# Patient Record
Sex: Female | Born: 1975 | Race: Black or African American | Hispanic: No | Marital: Married | Smoking: Never smoker
Health system: Southern US, Community
[De-identification: ages and names within clinical notes are randomized; demographics above are authoritative.]

## PROBLEM LIST (undated history)

## (undated) DIAGNOSIS — E785 Hyperlipidemia, unspecified: Secondary | ICD-10-CM

## (undated) DIAGNOSIS — T4145XA Adverse effect of unspecified anesthetic, initial encounter: Secondary | ICD-10-CM

## (undated) DIAGNOSIS — T7840XA Allergy, unspecified, initial encounter: Secondary | ICD-10-CM

## (undated) DIAGNOSIS — R011 Cardiac murmur, unspecified: Secondary | ICD-10-CM

## (undated) DIAGNOSIS — I10 Essential (primary) hypertension: Secondary | ICD-10-CM

## (undated) DIAGNOSIS — E039 Hypothyroidism, unspecified: Secondary | ICD-10-CM

## (undated) DIAGNOSIS — D649 Anemia, unspecified: Secondary | ICD-10-CM

## (undated) DIAGNOSIS — B009 Herpesviral infection, unspecified: Secondary | ICD-10-CM

## (undated) HISTORY — DX: Herpesviral infection, unspecified: B00.9

## (undated) HISTORY — PX: TUBAL LIGATION: SHX77

## (undated) HISTORY — DX: Cardiac murmur, unspecified: R01.1

## (undated) HISTORY — DX: Anemia, unspecified: D64.9

## (undated) HISTORY — DX: Allergy, unspecified, initial encounter: T78.40XA

## (undated) HISTORY — DX: Hyperlipidemia, unspecified: E78.5

## (undated) HISTORY — DX: Essential (primary) hypertension: I10

## (undated) HISTORY — PX: SUPERFICIAL LYMPH NODE BIOPSY / EXCISION: SUR127

---
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
## 2004-07-24 ENCOUNTER — Ambulatory Visit: Payer: Self-pay | Admitting: Endocrinology

## 2004-07-29 ENCOUNTER — Other Ambulatory Visit: Admission: RE | Admit: 2004-07-29 | Discharge: 2004-07-29 | Payer: Self-pay | Admitting: Obstetrics and Gynecology

## 2005-06-03 ENCOUNTER — Ambulatory Visit: Payer: Self-pay | Admitting: Internal Medicine

## 2005-06-11 ENCOUNTER — Ambulatory Visit: Payer: Self-pay | Admitting: Internal Medicine

## 2005-06-12 ENCOUNTER — Ambulatory Visit: Payer: Self-pay | Admitting: Internal Medicine

## 2005-06-22 HISTORY — PX: UTERINE SUSPENSION: SUR1430

## 2005-09-08 ENCOUNTER — Ambulatory Visit: Payer: Self-pay | Admitting: Endocrinology

## 2005-09-09 ENCOUNTER — Ambulatory Visit: Payer: Self-pay | Admitting: Endocrinology

## 2005-10-19 ENCOUNTER — Other Ambulatory Visit: Admission: RE | Admit: 2005-10-19 | Discharge: 2005-10-19 | Payer: Self-pay | Admitting: Obstetrics and Gynecology

## 2005-11-06 ENCOUNTER — Ambulatory Visit: Payer: Self-pay | Admitting: Endocrinology

## 2006-04-16 ENCOUNTER — Encounter: Admission: RE | Admit: 2006-04-16 | Discharge: 2006-04-16 | Payer: Self-pay | Admitting: Obstetrics and Gynecology

## 2006-08-03 ENCOUNTER — Ambulatory Visit: Payer: Self-pay | Admitting: Endocrinology

## 2006-08-03 LAB — CONVERTED CEMR LAB: TSH: 0.17 microintl units/mL — ABNORMAL LOW (ref 0.35–5.50)

## 2006-09-05 ENCOUNTER — Emergency Department (HOSPITAL_COMMUNITY): Admission: EM | Admit: 2006-09-05 | Discharge: 2006-09-05 | Payer: Self-pay | Admitting: Family Medicine

## 2006-09-27 ENCOUNTER — Emergency Department (HOSPITAL_COMMUNITY): Admission: EM | Admit: 2006-09-27 | Discharge: 2006-09-27 | Payer: Self-pay | Admitting: Family Medicine

## 2006-12-06 ENCOUNTER — Other Ambulatory Visit: Admission: RE | Admit: 2006-12-06 | Discharge: 2006-12-06 | Payer: Self-pay | Admitting: Obstetrics & Gynecology

## 2006-12-14 ENCOUNTER — Ambulatory Visit: Payer: Self-pay | Admitting: Endocrinology

## 2007-02-16 ENCOUNTER — Encounter: Payer: Self-pay | Admitting: Endocrinology

## 2007-06-23 DIAGNOSIS — T8859XA Other complications of anesthesia, initial encounter: Secondary | ICD-10-CM

## 2007-06-23 HISTORY — DX: Other complications of anesthesia, initial encounter: T88.59XA

## 2007-08-18 ENCOUNTER — Inpatient Hospital Stay (HOSPITAL_COMMUNITY): Admission: AD | Admit: 2007-08-18 | Discharge: 2007-08-18 | Payer: Self-pay | Admitting: Obstetrics & Gynecology

## 2007-09-02 ENCOUNTER — Ambulatory Visit: Payer: Self-pay | Admitting: Endocrinology

## 2007-09-16 ENCOUNTER — Telehealth: Payer: Self-pay | Admitting: Family Medicine

## 2007-10-07 ENCOUNTER — Encounter: Payer: Self-pay | Admitting: Endocrinology

## 2007-10-10 ENCOUNTER — Telehealth (INDEPENDENT_AMBULATORY_CARE_PROVIDER_SITE_OTHER): Payer: Self-pay | Admitting: *Deleted

## 2007-10-11 ENCOUNTER — Ambulatory Visit: Payer: Self-pay | Admitting: Endocrinology

## 2007-10-27 ENCOUNTER — Ambulatory Visit: Payer: Self-pay | Admitting: Endocrinology

## 2007-10-27 LAB — CONVERTED CEMR LAB: TSH: 1.49 microintl units/mL (ref 0.35–5.50)

## 2007-10-31 ENCOUNTER — Telehealth (INDEPENDENT_AMBULATORY_CARE_PROVIDER_SITE_OTHER): Payer: Self-pay | Admitting: *Deleted

## 2007-11-09 ENCOUNTER — Emergency Department (HOSPITAL_COMMUNITY): Admission: EM | Admit: 2007-11-09 | Discharge: 2007-11-09 | Payer: Self-pay | Admitting: Emergency Medicine

## 2008-02-28 ENCOUNTER — Inpatient Hospital Stay (HOSPITAL_COMMUNITY): Admission: AD | Admit: 2008-02-28 | Discharge: 2008-02-28 | Payer: Self-pay | Admitting: Obstetrics & Gynecology

## 2008-03-28 ENCOUNTER — Ambulatory Visit (HOSPITAL_COMMUNITY): Admission: RE | Admit: 2008-03-28 | Discharge: 2008-03-28 | Payer: Self-pay | Admitting: Obstetrics and Gynecology

## 2008-04-19 ENCOUNTER — Inpatient Hospital Stay (HOSPITAL_COMMUNITY): Admission: AD | Admit: 2008-04-19 | Discharge: 2008-04-23 | Payer: Self-pay | Admitting: Obstetrics

## 2008-12-02 ENCOUNTER — Emergency Department (HOSPITAL_COMMUNITY): Admission: EM | Admit: 2008-12-02 | Discharge: 2008-12-02 | Payer: Self-pay | Admitting: Emergency Medicine

## 2009-06-18 ENCOUNTER — Inpatient Hospital Stay (HOSPITAL_COMMUNITY): Admission: RE | Admit: 2009-06-18 | Discharge: 2009-06-21 | Payer: Self-pay | Admitting: Obstetrics and Gynecology

## 2009-07-30 LAB — HM PAP SMEAR

## 2009-09-03 ENCOUNTER — Emergency Department (HOSPITAL_COMMUNITY): Admission: EM | Admit: 2009-09-03 | Discharge: 2009-09-03 | Payer: Self-pay | Admitting: Internal Medicine

## 2010-03-28 ENCOUNTER — Emergency Department (HOSPITAL_COMMUNITY): Admission: EM | Admit: 2010-03-28 | Discharge: 2010-03-28 | Payer: Self-pay | Admitting: Family Medicine

## 2010-09-04 LAB — POCT RAPID STREP A (OFFICE): Streptococcus, Group A Screen (Direct): NEGATIVE

## 2010-09-22 LAB — CBC
HCT: 26.5 % — ABNORMAL LOW (ref 36.0–46.0)
Hemoglobin: 11.4 g/dL — ABNORMAL LOW (ref 12.0–15.0)
MCHC: 34.2 g/dL (ref 30.0–36.0)
MCV: 92.5 fL (ref 78.0–100.0)
RBC: 2.86 MIL/uL — ABNORMAL LOW (ref 3.87–5.11)
RBC: 3.64 MIL/uL — ABNORMAL LOW (ref 3.87–5.11)
WBC: 10.4 10*3/uL (ref 4.0–10.5)

## 2010-09-22 LAB — RH IMMUNE GLOB WKUP(>/=20WKS)(NOT WOMEN'S HOSP)

## 2010-11-04 NOTE — Op Note (Signed)
Jordan [name], Jordan [name]             ACCOUNT NO.:  [id number]   MEDICAL RECORD NO.:  [id number]          PATIENT TYPE:  INP   LOCATION:  9104                          FACILITY:  [facility]   PHYSICIAN:  Maxie Better, M.D.DATE OF BIRTH:  1976/03/23   DATE OF PROCEDURE:  04/21/2008  DATE OF DISCHARGE:                               OPERATIVE REPORT   PREOPERATIVE DIAGNOSIS:  Arrest of dilatation.   PROCEDURES:  Primary cesarean section  Kerr hysterotomy.   POSTOPERATIVE DIAGNOSIS:  Arrest of dilatation.   ANESTHESIA:  Epidural.   SURGEON:  Maxie Better, MD   ASSISTANT:  None.   PROCEDURE:  Under adequate epidural anesthesia, the patient was placed  in the supine position with a left lateral tilt.  She was sterilely  prepped and draped in usual fashion.  Indwelling Foley catheter was  already in place.  Marcaine 0.25% was injected along the planned  Pfannenstiel skin incision site.  Pfannenstiel skin incision was then  made, carried down to the rectus fascia.  The rectus fascia was opened  transversely.  The rectus fascia was then bluntly and sharply dissected  off the rectus muscle in superior and inferior fashion.  The rectus  muscle was split in the midline.  The parietal peritoneum was entered  bluntly.  The vesicouterine peritoneum was then opened transversely.  The bladder was then bluntly dissected off the lower uterine segment and  displaced inferiorly.  A curvilinear low transverse uterine incision was  then made and extended with bandage scissors.  Subsequent delivery of a  live female infant from the left occiput posterior presentation was  accomplished.  Baby was bulb suctioned in the abdomen.  Cord was clamped  and cut.  The baby was transferred to the awaiting pediatrician who  assigned Apgars of 8 and 9 at 1 and 5 minutes.  Placenta was manually  removed.  Uterine cavity was cleaned of debris.  Uterine incision had a  small extension on the right about 2 cm, which  was closed individually  with 0 Monocryl running locked stitch.  The remaining incision was  closed with 2 layers, the first layer was 0 Monocryl running locked  stitch, second layer was imbricating using 0 Monocryl suture.  Figure-of-  eight suture was placed on the right due to bleeding.  Good hemostasis  subsequently noted.  Normal tubes and ovaries were identified  bilaterally.  The abdomen was copiously irrigated and suctioned of  debris.  The parietal peritoneum, which was opened underneath the right  rectus abdominis muscle was not closed due to its spontaneous placement  underneath the muscle.  The rectus fascia was closed with 0 Vicryl x2.  The subcutaneous area was irrigated and small bleeders cauterized.  Interrupted 2-0 plain sutures were placed and skin approximated with  Ethicon staples.  Specimens, placenta was not sent to  pathology. Estimated blood loss was 500 mL.  Urine output was 200 mL  clear yellow urine.  Intraoperative fluid was 2 L. Sponge and instrument  counts x2 was correct.  Complication was none.  Weight of the baby was 6  pounds 8 ounces.  The patient tolerated the procedure well and was  transferred to recovery in stable condition.      Maxie Better, M.D.  Electronically Signed     [place]/MEDQ  D:  04/21/2008  T:  04/22/2008  Job:  [id number]

## 2010-11-07 NOTE — Discharge Summary (Signed)
Jordan [name], [name]             ACCOUNT NO.:  [id number]   MEDICAL RECORD NO.:  [id number]          PATIENT TYPE:  INP   LOCATION:  9123                          FACILITY:  [facility]   PHYSICIAN:  Maxie Better, M.D.DATE OF BIRTH:  February 17, 1976   DATE OF ADMISSION:  04/19/2008  DATE OF DISCHARGE:  04/23/2008                               DISCHARGE SUMMARY   ADMISSION DIAGNOSES:  1. Hypothyroidism.  2. Term gestation.  3. Rhesus negative.   DISCHARGE DIAGNOSES:  1. Hypothyroidism.  2. Term gestation, delivered.  3. Rhesus negative.  4. Arrested dilatation.   PROCEDURE:  A primary cesarean section.   HISTORY OF PRESENT ILLNESS:  A 35 year old gravida 2, para 0-0-1-0  female at term with hypothyroid on Synthroid who presented for induction  of labor.  Prenatal course was notable for ultrasound on April 12, 2008, that showed a 6-pound 8-ounce baby, which was at 23rd percentile  overall, but the abdominal circumference was at the 4th percentile.   HOSPITAL COURSE:  The patient was admitted to [facility].  She  underwent cervical ripening including Cervidil as well as Cytotec.  The  patient subsequently had artificial rupture of membranes and clear  fluid.  Pitocin was started.  She progressed to 9.5 cm.  Baby was  asymptomatic and subsequently resulted in arrest of dilatation with the  primary cesarean section performed.  Cesarean section resulted in  delivery of a left occiput posterior live female 6 pounds 8 ounces with  Apgars of 8 and 9.  The patient subsequently has an uncomplicated  postoperative course for which she was continued on her Synthroid.  Her  CBC on postop day #1 showed a hemoglobin of 11.3, hematocrit 33.5, white  count of 19.5, and platelet count 198,000.  She was given RhoGAM.  By  postop day #3, the patient had remained afebrile throughout her course  and was deemed well to be discharged home.   DISPOSITION:  Home.   CONDITION:  Stable.   DISCHARGE MEDICATIONS:  1. Ibuprofen 600 mg 1 p.o. q.6 h. p.r.n. pain.  2. Prenatal vitamins 1 p.o. daily.   FOLLOWUP APPOINTMENT:  At [facility] in 6 weeks.  Discharge  instructions per the postpartum booklet given.      Maxie Better, M.D.  Electronically Signed     [place]/MEDQ  D:  05/20/2008  T:  05/20/2008  Job:  [id number]

## 2011-03-13 LAB — HCG, QUANTITATIVE, PREGNANCY: hCG, Beta Chain, Quant, S: 4746 — ABNORMAL HIGH

## 2011-03-13 LAB — ABO/RH: ABO/RH(D): A NEG

## 2011-03-23 LAB — CBC
MCHC: 33.5
MCV: 90.3
Platelets: 219
RDW: 14.1
WBC: 15.2 — ABNORMAL HIGH

## 2011-03-23 LAB — RPR: RPR Ser Ql: NONREACTIVE

## 2011-03-24 LAB — CBC
HCT: 33.5 — ABNORMAL LOW
MCHC: 33.6
MCV: 90.8
RBC: 3.69 — ABNORMAL LOW

## 2011-03-24 LAB — RH IMMUNE GLOB WKUP(>/=20WKS)(NOT WOMEN'S HOSP)

## 2011-05-10 ENCOUNTER — Emergency Department (HOSPITAL_COMMUNITY)
Admission: EM | Admit: 2011-05-10 | Discharge: 2011-05-10 | Disposition: A | Discharge status: Home / Self Care | Payer: 59 | Source: Home / Self Care | Attending: Emergency Medicine | Admitting: Emergency Medicine

## 2011-05-10 NOTE — ED Notes (Signed)
Called for pt; informed that pt. Has left with no intention to return this evening.

## 2011-05-12 ENCOUNTER — Encounter: Payer: Self-pay | Admitting: Emergency Medicine

## 2011-05-12 ENCOUNTER — Emergency Department (INDEPENDENT_AMBULATORY_CARE_PROVIDER_SITE_OTHER)
Admission: EM | Admit: 2011-05-12 | Discharge: 2011-05-12 | Disposition: A | Discharge status: Home / Self Care | Payer: 59 | Source: Home / Self Care

## 2011-05-12 DIAGNOSIS — R058 Other specified cough: Secondary | ICD-10-CM

## 2011-05-12 DIAGNOSIS — R059 Cough, unspecified: Secondary | ICD-10-CM

## 2011-05-12 DIAGNOSIS — R05 Cough: Secondary | ICD-10-CM

## 2011-05-12 HISTORY — DX: Hypothyroidism, unspecified: E03.9

## 2011-05-12 MED ORDER — PREDNISONE 20 MG PO TABS: ORAL_TABLET | ORAL | Status: AC

## 2011-05-12 MED ORDER — GUAIFENESIN ER 600 MG PO TB12: 1200.0000 mg | ORAL_TABLET | Freq: Two times a day (BID) | ORAL | Status: DC

## 2011-05-12 MED ORDER — FEXOFENADINE-PSEUDOEPHED ER 60-120 MG PO TB12: 1.0000 | ORAL_TABLET | Freq: Two times a day (BID) | ORAL | Status: DC

## 2011-05-12 NOTE — ED Notes (Signed)
Cough and chest discomfort, onset 2 weeks ago , non productive cough

## 2011-05-12 NOTE — ED Provider Notes (Signed)
History     CSN: [id number] Arrival date & time: 05/12/2011  6:29 PM   First MD Initiated Contact with Patient 05/12/11 1721      Chief Complaint  Patient presents with  . Cough    (Consider location/radiation/quality/duration/timing/severity/associated sxs/prior treatment) Patient is a 34 y.o. female presenting with cough. The history is provided by the patient.  Cough This is a new problem. The current episode started more than 1 week ago. The problem occurs constantly. The problem has not changed since onset.The cough is non-productive. There has been no fever. Associated symptoms include sweats and rhinorrhea. Pertinent negatives include no chest pain, no weight loss, no headaches, no sore throat, no myalgias, no shortness of breath and no wheezing. She has tried cough syrup (was seen 2 days ago in a Urgent Care  finishing azithromycin Rx. ) for the symptoms. The treatment provided mild relief. She is not a smoker. Her past medical history does not include bronchitis or pneumonia.    Past Medical History  Diagnosis Date  . Hypothyroid     Past Surgical History  Procedure Date  . Cesarean section     History reviewed. No pertinent family history.  History  Substance Use Topics  . Smoking status: Never Smoker   . Smokeless tobacco: Not on file  . Alcohol Use: No    OB History    Grav Para Term Preterm Abortions TAB SAB Ect Mult Living                  Review of Systems  Constitutional: Negative.  Negative for weight loss.  HENT: Positive for congestion and rhinorrhea. Negative for sore throat.   Respiratory: Positive for cough. Negative for chest tightness, shortness of breath, wheezing and stridor.   Cardiovascular: Negative for chest pain, palpitations and leg swelling.  Gastrointestinal: Negative for abdominal pain.  Musculoskeletal: Negative for myalgias and arthralgias.  Skin: Negative for rash.  Neurological: Negative for headaches.    Allergies    Review of patient's allergies indicates no known allergies.  Home Medications   Current Outpatient Rx  Name Route Sig Dispense Refill  . ERYTHROMYCIN STEARATE 500 MG PO TABS Oral Take 500 mg by mouth 3 (three) times daily.      Marland Kitchen LEVOTHYROXINE SODIUM 200 MCG PO TABS Oral Take 200 mcg by mouth daily.      Marland Kitchen FEXOFENADINE-PSEUDOEPHEDRINE 60-120 MG PO TB12 Oral Take 1 tablet by mouth every 12 (twelve) hours. 30 tablet 0  . GUAIFENESIN 600 MG PO TB12 Oral Take 2 tablets (1,200 mg total) by mouth 2 (two) times daily. 10 tablet no  . PREDNISONE 20 MG PO TABS  2 tabs po daily for 5 days 10 tablet no    BP 145/80  Pulse 78  Temp(Src) 99.3 F (37.4 C) (Oral)  Resp 18  SpO2 100%  Physical Exam  Nursing note and vitals reviewed. Constitutional: She is oriented to person, place, and time. She appears well-developed and well-nourished. No distress.  HENT:  Head: Normocephalic and atraumatic.  Right Ear: External ear normal.  Left Ear: External ear normal.  Mouth/Throat: Oropharynx is clear and moist. No oropharyngeal exudate.       Swelling and erythema of nasal turbinates with clear rhinorrhea. Post nasal drip with pharyngeal erythema no exudates.   Eyes: Pupils are equal, round, and reactive to light.       Mild conjunctival erythema bilaterally no eye discharge.   Neck: Normal range of motion.  Cardiovascular: Normal rate,  regular rhythm and normal heart sounds.  Exam reveals no gallop and no friction rub.   No murmur heard.      No low extremity edema.  Pulmonary/Chest: Effort normal and breath sounds normal. No respiratory distress. She has no wheezes. She has no rales. She exhibits no tenderness.       No tachypnea.  Abdominal: Soft. There is no tenderness.  Lymphadenopathy:    She has no cervical adenopathy.  Neurological: She is alert and oriented to person, place, and time.  Skin: No rash noted.    ED Course  Procedures (including critical care time)  Labs Reviewed - No  data to display No results found.   1. Allergic cough       MDM  Impress residual reactive cough from recent URI likely viral as not symptoms change with antibiotic also possible seasonal allergies. Afebrile looks well on exam. Treated with prednisone, guaifenesin, antihistamine with decongestant.        Sharin Grave, MD 05/14/11 1016

## 2011-06-03 ENCOUNTER — Encounter (HOSPITAL_COMMUNITY): Payer: Self-pay

## 2011-06-03 ENCOUNTER — Emergency Department (INDEPENDENT_AMBULATORY_CARE_PROVIDER_SITE_OTHER)
Admission: EM | Admit: 2011-06-03 | Discharge: 2011-06-03 | Disposition: A | Discharge status: Home / Self Care | Payer: 59 | Source: Home / Self Care | Attending: Family Medicine | Admitting: Family Medicine

## 2011-06-03 DIAGNOSIS — R6889 Other general symptoms and signs: Secondary | ICD-10-CM

## 2011-06-03 DIAGNOSIS — J111 Influenza due to unidentified influenza virus with other respiratory manifestations: Secondary | ICD-10-CM

## 2011-06-03 NOTE — ED Notes (Signed)
C/o generalized bodyaches, dry cough, chills, headache, and abdominal cramps since yesterday.  Denies n,v or diarrhea, denies head or sinus congestion.

## 2011-06-03 NOTE — ED Provider Notes (Signed)
History     CSN: [id number] Arrival date & time: 06/03/2011  8:53 AM   First MD Initiated Contact with Patient 06/03/11 [phone]      Chief Complaint  Patient presents with  . Generalized Body Aches  . Fever  . Cough    (Consider location/radiation/quality/duration/timing/severity/associated sxs/prior treatment) HPI Comments: Jordan [name] presents for evaluation of fever, cough, abdominal pain, body aches, and a headache over the last few days. She reports her sons as sick contacts.  Patient is a 35 y.o. female presenting with cough. The history is provided by the patient.  Cough This is a new problem. The current episode started more than 2 days ago. The problem occurs constantly. The cough is non-productive. The maximum temperature recorded prior to her arrival was 101 to 101.9 F. Associated symptoms include chills, headaches, rhinorrhea and myalgias. Pertinent negatives include no sore throat. She is not a smoker.    Past Medical History  Diagnosis Date  . Hypothyroid     Past Surgical History  Procedure Date  . Cesarean section     No family history on file.  History  Substance Use Topics  . Smoking status: Never Smoker   . Smokeless tobacco: Not on file  . Alcohol Use: No    OB History    Grav Para Term Preterm Abortions TAB SAB Ect Mult Living                  Review of Systems  Constitutional: Positive for fever and chills.  HENT: Positive for congestion and rhinorrhea. Negative for sore throat.   Eyes: Negative.   Respiratory: Positive for cough.   Cardiovascular: Negative.   Gastrointestinal: Positive for nausea and abdominal pain. Negative for vomiting.  Genitourinary: Negative.   Musculoskeletal: Positive for myalgias.  Neurological: Positive for headaches.    Allergies  Review of patient's allergies indicates no known allergies.  Home Medications   Current Outpatient Rx  Name Route Sig Dispense Refill  . ERYTHROMYCIN STEARATE 500 MG PO TABS Oral  Take 500 mg by mouth 3 (three) times daily.      Marland Kitchen FEXOFENADINE-PSEUDOEPHED ER 60-120 MG PO TB12 Oral Take 1 tablet by mouth every 12 (twelve) hours. 30 tablet 0  . GUAIFENESIN ER 600 MG PO TB12 Oral Take 2 tablets (1,200 mg total) by mouth 2 (two) times daily. 10 tablet no  . LEVOTHYROXINE SODIUM 200 MCG PO TABS Oral Take 200 mcg by mouth daily.        BP 132/76  Pulse 91  Temp(Src) 98.2 F (36.8 C) (Oral)  Resp 20  SpO2 100%  LMP 05/25/2011  Physical Exam  Nursing note and vitals reviewed. Constitutional: She is oriented to person, place, and time. She appears well-developed and well-nourished.  HENT:  Head: Normocephalic and atraumatic.  Right Ear: Tympanic membrane and external ear normal.  Left Ear: Tympanic membrane and external ear normal.  Mouth/Throat: Uvula is midline, oropharynx is clear and moist and mucous membranes are normal.  Eyes: EOM are normal.  Neck: Normal range of motion.  Pulmonary/Chest: Effort normal and breath sounds normal. She has no wheezes. She has no rhonchi.  Abdominal: Soft. Bowel sounds are normal. There is tenderness in the right upper quadrant and epigastric area. There is no rebound and no guarding.  Musculoskeletal: Normal range of motion.  Neurological: She is alert and oriented to person, place, and time.  Skin: Skin is warm and dry.  Psychiatric: Her behavior is normal.    ED Course  Procedures (including critical care time)  Labs Reviewed - No data to display No results found.   1. Influenza-like illness       MDM          Richardo Priest, MD 06/03/11 1001

## 2011-06-08 ENCOUNTER — Telehealth (HOSPITAL_COMMUNITY): Payer: Self-pay | Admitting: *Deleted

## 2011-09-07 ENCOUNTER — Emergency Department (INDEPENDENT_AMBULATORY_CARE_PROVIDER_SITE_OTHER)
Admission: EM | Admit: 2011-09-07 | Discharge: 2011-09-07 | Disposition: A | Discharge status: Home / Self Care | Payer: 59 | Source: Home / Self Care | Attending: Emergency Medicine | Admitting: Emergency Medicine

## 2011-09-07 ENCOUNTER — Encounter (HOSPITAL_COMMUNITY): Payer: Self-pay | Admitting: *Deleted

## 2011-09-07 DIAGNOSIS — E039 Hypothyroidism, unspecified: Secondary | ICD-10-CM

## 2011-09-07 DIAGNOSIS — Z760 Encounter for issue of repeat prescription: Secondary | ICD-10-CM

## 2011-09-07 MED ORDER — LEVOTHYROXINE SODIUM 200 MCG PO TABS: 200.0000 ug | ORAL_TABLET | Freq: Every day | ORAL | Status: DC

## 2011-09-07 NOTE — Discharge Instructions (Signed)
Hypothyroidism The thyroid is a large gland located in the lower front of your neck. The thyroid gland helps control metabolism. Metabolism is how your body handles food. It controls metabolism with the hormone thyroxine. When this gland is underactive (hypothyroid), it produces too little hormone.  CAUSES These include:   Absence or destruction of thyroid tissue.   Goiter due to iodine deficiency.   Goiter due to medications.   Congenital defects (since birth).   Problems with the pituitary. This causes a lack of TSH (thyroid stimulating hormone). This hormone tells the thyroid to turn out more hormone.  SYMPTOMS  Lethargy (feeling as though you have no energy)   Cold intolerance   Weight gain (in spite of normal food intake)   Dry skin   Coarse hair   Menstrual irregularity (if severe, may lead to infertility)   Slowing of thought processes  Cardiac problems are also caused by insufficient amounts of thyroid hormone. Hypothyroidism in the newborn is cretinism, and is an extreme form. It is important that this form be treated adequately and immediately or it will lead rapidly to retarded physical and mental development. DIAGNOSIS  To prove hypothyroidism, your caregiver may do blood tests and ultrasound tests. Sometimes the signs are hidden. It may be necessary for your caregiver to watch this illness with blood tests either before or after diagnosis and treatment. TREATMENT  Low levels of thyroid hormone are increased by using synthetic thyroid hormone. This is a safe, effective treatment. It usually takes about four weeks to gain the full effects of the medication. After you have the full effect of the medication, it will generally take another four weeks for problems to leave. Your caregiver may start you on low doses. If you have had heart problems the dose may be gradually increased. It is generally not an emergency to get rapidly to normal. HOME CARE INSTRUCTIONS   Take  your medications as your caregiver suggests. Let your caregiver know of any medications you are taking or start taking. Your caregiver will help you with dosage schedules.   As your condition improves, your dosage needs may increase. It will be necessary to have continuing blood tests as suggested by your caregiver.   Report all suspected medication side effects to your caregiver.  SEEK MEDICAL CARE IF: Seek medical care if you develop:  Sweating.   Tremulousness (tremors).   Anxiety.   Rapid weight loss.   Heat intolerance.   Emotional swings.   Diarrhea.   Weakness.  SEEK IMMEDIATE MEDICAL CARE IF:  You develop chest pain, an irregular heart beat (palpitations), or a rapid heart beat. MAKE SURE YOU:   Understand these instructions.   Will watch your condition.   Will get help right away if you are not doing well or get worse.  Document Released: 06/08/2005 Document Revised: 05/28/2011 Document Reviewed: 01/27/2008 [facility] Patient Information 2012 [place], [place].Medication Refill, Emergency Department We have refilled your medication today as a courtesy to you. It is best for your medical care, however, to take care of getting refills done through your primary caregiver's office. They have your records and can do a better job of follow-up than we can in the emergency department. On maintenance medications, we often only prescribe enough medications to get you by until you are able to see your regular caregiver. This is a more expensive way to refill medications. In the future, please plan for refills so that you will not have to use the emergency department for  this. Thank you for your help. Your help allows [place] to better take care of the daily emergencies that enter our department. Document Released: 09/25/2003 Document Revised: 05/28/2011 Document Reviewed: 06/08/2005 [facility] Patient Information 2012 [place], [place].

## 2011-09-07 NOTE — ED Notes (Signed)
Pt  Reports  She  Has  Been  Without  Her  synthriod    For 5  Days     She  Reports  Her PCP  IS   In  [place]    SHE  REPORTS  FEELINGS  OFV WEAKNESS      TIRED  AND  SHAKEY

## 2011-09-07 NOTE — ED Provider Notes (Signed)
History     CSN: [id number]  Arrival date & time 09/07/11  1536   First MD Initiated Contact with Patient 09/07/11 1604      Chief Complaint  Patient presents with  . Medication Refill    (Consider location/radiation/quality/duration/timing/severity/associated sxs/prior treatment) HPI Comments: Patient presents to urgent care today requesting a Synthroid refill it she has: Doctors often they're unable to see her within the next 2 months. She is elicited no specific symptoms. Strictly here for a medication refill and feels that within the last 5 days been feeling a little bit more tired and fatigued she attributes to her not taking her Synthroid  The history is provided by the patient.    Past Medical History  Diagnosis Date  . Hypothyroid     Past Surgical History  Procedure Date  . Cesarean section     No family history on file.  History  Substance Use Topics  . Smoking status: Never Smoker   . Smokeless tobacco: Not on file  . Alcohol Use: No    OB History    Grav Para Term Preterm Abortions TAB SAB Ect Mult Living                  Review of Systems  Constitutional: Positive for fatigue. Negative for fever and chills.  Eyes: Negative for visual disturbance.  Cardiovascular: Negative for chest pain and palpitations.  Neurological: Negative for dizziness and headaches.    Allergies  Review of patient's allergies indicates no known allergies.  Home Medications   Current Outpatient Rx  Name Route Sig Dispense Refill  . LEVOTHYROXINE SODIUM 200 MCG PO TABS Oral Take 1 tablet (200 mcg total) by mouth daily. 30 tablet 1    BP 132/85  Pulse 84  Temp(Src) 98.8 F (37.1 C) (Oral)  Resp 18  SpO2 100%  LMP 08/23/2011  Physical Exam  Nursing note and vitals reviewed. Constitutional: She is oriented to person, place, and time. She appears well-developed and well-nourished. No distress.  Neurological: She is alert and oriented to person, place, and time.    Skin: Skin is warm. She is not diaphoretic.    ED Course  Procedures (including critical care time)  Labs Reviewed - No data to display No results found.   1. HYPOTHYROIDISM   2. Encounter for medication refill       MDM  Patient requesting Synthroid refill        Jimmie Molly, MD 09/07/11 2128

## 2012-03-07 ENCOUNTER — Encounter (HOSPITAL_COMMUNITY): Payer: Self-pay

## 2012-03-11 ENCOUNTER — Encounter (HOSPITAL_COMMUNITY): Payer: Self-pay

## 2012-03-11 ENCOUNTER — Encounter (HOSPITAL_COMMUNITY)
Admission: RE | Admit: 2012-03-11 | Discharge: 2012-03-11 | Disposition: A | Discharge status: Home / Self Care | Payer: 59 | Source: Ambulatory Visit | Attending: Obstetrics and Gynecology | Admitting: Obstetrics and Gynecology

## 2012-03-11 HISTORY — DX: Adverse effect of unspecified anesthetic, initial encounter: T41.45XA

## 2012-03-11 LAB — CBC
MCHC: 32 g/dL (ref 30.0–36.0)
MCV: 84.8 fL (ref 78.0–100.0)
Platelets: 288 10*3/uL (ref 150–400)
RDW: 13.8 % (ref 11.5–15.5)
WBC: 6.1 10*3/uL (ref 4.0–10.5)

## 2012-03-11 NOTE — Patient Instructions (Addendum)
[address] IS[address]LE TUCKETT  03/11/2012   Your procedure is scheduled on:  03/21/12  Enter through the Main Entrance of [facility] at 730 AM.  Pick up the phone at the desk and dial [phone].   Call this number if you have problems the morning of surgery: [phone]   Remember:   Do not eat food:After Midnight.  Do not drink clear liquids: After Midnight.  Take these medicines the morning of surgery with A SIP OF WATER: thyroid medication   Do not wear jewelry, make-up or nail polish.  Do not wear lotions, powders, or perfumes. You may wear deodorant.  Do not shave 48 hours prior to surgery.  Do not bring valuables to the hospital.  Contacts, dentures or bridgework may not be worn into surgery.  Leave suitcase in the car. After surgery it may be brought to your room.  For patients admitted to the hospital, checkout time is 11:00 AM the day of discharge.   Patients discharged the day of surgery will not be allowed to drive home.  Name and phone number of your driver: husband  Special Instructions: Shower using CHG 2 nights before surgery and the night before surgery.  If you shower the day of surgery use CHG.  Use special wash - you have one bottle of CHG for all showers.  You should use approximately 1/3 of the bottle for each shower.   Please read over the following fact sheets that you were given: MRSA Information

## 2012-03-14 ENCOUNTER — Other Ambulatory Visit: Payer: Self-pay | Admitting: Obstetrics and Gynecology

## 2012-03-16 ENCOUNTER — Other Ambulatory Visit: Payer: Self-pay | Admitting: Obstetrics and Gynecology

## 2012-03-21 ENCOUNTER — Encounter (HOSPITAL_COMMUNITY): Payer: Self-pay | Admitting: Anesthesiology

## 2012-03-21 ENCOUNTER — Ambulatory Visit (HOSPITAL_COMMUNITY): Payer: 59 | Admitting: Anesthesiology

## 2012-03-21 ENCOUNTER — Ambulatory Visit (HOSPITAL_COMMUNITY)
Admission: AD | Admit: 2012-03-21 | Discharge: 2012-03-21 | Disposition: A | Discharge status: Home / Self Care | Payer: 59 | Source: Ambulatory Visit | Attending: Obstetrics and Gynecology | Admitting: Obstetrics and Gynecology

## 2012-03-21 ENCOUNTER — Encounter (HOSPITAL_COMMUNITY)
Admission: AD | Disposition: A | Discharge status: Home / Self Care | Payer: Self-pay | Source: Ambulatory Visit | Attending: Obstetrics and Gynecology

## 2012-03-21 DIAGNOSIS — Z302 Encounter for sterilization: Secondary | ICD-10-CM | POA: Insufficient documentation

## 2012-03-21 DIAGNOSIS — Z30432 Encounter for removal of intrauterine contraceptive device: Secondary | ICD-10-CM | POA: Insufficient documentation

## 2012-03-21 HISTORY — PX: IUD REMOVAL: SHX5392

## 2012-03-21 HISTORY — PX: LAPAROSCOPIC TUBAL LIGATION: SHX1937

## 2012-03-21 HISTORY — PX: HYSTEROSCOPY: SHX211

## 2012-03-21 SURGERY — LIGATION, FALLOPIAN TUBE, LAPAROSCOPIC
Anesthesia: General | Wound class: Clean Contaminated

## 2012-03-21 MED ORDER — FENTANYL CITRATE 0.05 MG/ML IJ SOLN
INTRAMUSCULAR | Status: DC | PRN
  Administered 2012-03-21: 100 ug via INTRAVENOUS
  Administered 2012-03-21 (×3): 50 ug via INTRAVENOUS

## 2012-03-21 MED ORDER — IBUPROFEN 800 MG PO TABS: 800.0000 mg | ORAL_TABLET | Freq: Three times a day (TID) | ORAL | Status: DC | PRN

## 2012-03-21 MED ORDER — BUPIVACAINE HCL (PF) 0.25 % IJ SOLN
INTRAMUSCULAR | Status: DC | PRN
  Administered 2012-03-21: 4 mL

## 2012-03-21 MED ORDER — DEXAMETHASONE SODIUM PHOSPHATE 10 MG/ML IJ SOLN
INTRAMUSCULAR | Status: AC
  Filled 2012-03-21: qty 1

## 2012-03-21 MED ORDER — FENTANYL CITRATE 0.05 MG/ML IJ SOLN
INTRAMUSCULAR | Status: AC
  Filled 2012-03-21: qty 5

## 2012-03-21 MED ORDER — NEOSTIGMINE METHYLSULFATE 1 MG/ML IJ SOLN
INTRAMUSCULAR | Status: DC | PRN
  Administered 2012-03-21: 2 mg via INTRAVENOUS

## 2012-03-21 MED ORDER — LIDOCAINE HCL (CARDIAC) 20 MG/ML IV SOLN
INTRAVENOUS | Status: AC
  Filled 2012-03-21: qty 5

## 2012-03-21 MED ORDER — GLYCOPYRROLATE 0.2 MG/ML IJ SOLN
INTRAMUSCULAR | Status: DC | PRN
  Administered 2012-03-21: 0.4 mg via INTRAVENOUS

## 2012-03-21 MED ORDER — PROPOFOL 10 MG/ML IV EMUL
INTRAVENOUS | Status: DC | PRN
  Administered 2012-03-21: 25 mg via INTRAVENOUS
  Administered 2012-03-21: 5 mg via INTRAVENOUS

## 2012-03-21 MED ORDER — GLYCINE 1.5 % IR SOLN
Status: DC | PRN
  Administered 2012-03-21: 500 mL

## 2012-03-21 MED ORDER — CHLOROPROCAINE HCL 1 % IJ SOLN
INTRAMUSCULAR | Status: AC
  Filled 2012-03-21: qty 30

## 2012-03-21 MED ORDER — KETOROLAC TROMETHAMINE 30 MG/ML IJ SOLN
INTRAMUSCULAR | Status: DC | PRN
  Administered 2012-03-21: 30 mg via INTRAVENOUS
  Administered 2012-03-21: 30 mg via INTRAMUSCULAR

## 2012-03-21 MED ORDER — 0.9 % SODIUM CHLORIDE (POUR BTL) OPTIME
TOPICAL | Status: DC | PRN
  Administered 2012-03-21: 1000 mL

## 2012-03-21 MED ORDER — NEOSTIGMINE METHYLSULFATE 1 MG/ML IJ SOLN
INTRAMUSCULAR | Status: AC
  Filled 2012-03-21: qty 10

## 2012-03-21 MED ORDER — PROMETHAZINE HCL 25 MG/ML IJ SOLN: 6.2500 mg | INTRAMUSCULAR | Status: DC | PRN

## 2012-03-21 MED ORDER — LIDOCAINE HCL (CARDIAC) 20 MG/ML IV SOLN
INTRAVENOUS | Status: DC | PRN
  Administered 2012-03-21: 80 mg via INTRAVENOUS

## 2012-03-21 MED ORDER — FENTANYL CITRATE 0.05 MG/ML IJ SOLN
INTRAMUSCULAR | Status: AC
  Filled 2012-03-21: qty 2

## 2012-03-21 MED ORDER — FENTANYL CITRATE 0.05 MG/ML IJ SOLN: 25.0000 ug | INTRAMUSCULAR | Status: DC | PRN

## 2012-03-21 MED ORDER — MEPERIDINE HCL 25 MG/ML IJ SOLN: 6.2500 mg | INTRAMUSCULAR | Status: DC | PRN

## 2012-03-21 MED ORDER — MIDAZOLAM HCL 5 MG/5ML IJ SOLN
INTRAMUSCULAR | Status: DC | PRN
  Administered 2012-03-21: 2 mg via INTRAVENOUS

## 2012-03-21 MED ORDER — HYDROCODONE-ACETAMINOPHEN 5-500 MG PO TABS: 1.0000 | ORAL_TABLET | Freq: Four times a day (QID) | ORAL | Status: DC | PRN

## 2012-03-21 MED ORDER — ROCURONIUM BROMIDE 100 MG/10ML IV SOLN
INTRAVENOUS | Status: DC | PRN
  Administered 2012-03-21: 5 mg via INTRAVENOUS
  Administered 2012-03-21: 25 mg via INTRAVENOUS

## 2012-03-21 MED ORDER — MIDAZOLAM HCL 2 MG/2ML IJ SOLN: 0.5000 mg | Freq: Once | INTRAMUSCULAR | Status: DC | PRN

## 2012-03-21 MED ORDER — DEXAMETHASONE SODIUM PHOSPHATE 4 MG/ML IJ SOLN
INTRAMUSCULAR | Status: DC | PRN
  Administered 2012-03-21: 10 mg via INTRAVENOUS

## 2012-03-21 MED ORDER — BUPIVACAINE HCL (PF) 0.25 % IJ SOLN
INTRAMUSCULAR | Status: AC
  Filled 2012-03-21: qty 30

## 2012-03-21 MED ORDER — ONDANSETRON HCL 4 MG/2ML IJ SOLN
INTRAMUSCULAR | Status: DC | PRN
  Administered 2012-03-21: 4 mg via INTRAVENOUS

## 2012-03-21 MED ORDER — LACTATED RINGERS IV SOLN
INTRAVENOUS | Status: DC
  Administered 2012-03-21 (×3): via INTRAVENOUS

## 2012-03-21 MED ORDER — KETOROLAC TROMETHAMINE 60 MG/2ML IM SOLN
INTRAMUSCULAR | Status: AC
  Filled 2012-03-21: qty 2

## 2012-03-21 MED ORDER — ONDANSETRON HCL 4 MG/2ML IJ SOLN
INTRAMUSCULAR | Status: AC
  Filled 2012-03-21: qty 2

## 2012-03-21 MED ORDER — CHLOROPROCAINE HCL 1 % IJ SOLN
INTRAMUSCULAR | Status: DC | PRN
  Administered 2012-03-21: 10 mL

## 2012-03-21 MED ORDER — PROPOFOL 10 MG/ML IV EMUL
INTRAVENOUS | Status: AC
  Filled 2012-03-21: qty 20

## 2012-03-21 MED ORDER — GLYCOPYRROLATE 0.2 MG/ML IJ SOLN
INTRAMUSCULAR | Status: AC
  Filled 2012-03-21: qty 2

## 2012-03-21 MED ORDER — KETOROLAC TROMETHAMINE 30 MG/ML IJ SOLN: 15.0000 mg | Freq: Once | INTRAMUSCULAR | Status: DC | PRN

## 2012-03-21 MED ORDER — MIDAZOLAM HCL 2 MG/2ML IJ SOLN
INTRAMUSCULAR | Status: AC
  Filled 2012-03-21: qty 2

## 2012-03-21 MED ORDER — ROCURONIUM BROMIDE 50 MG/5ML IV SOLN
INTRAVENOUS | Status: AC
  Filled 2012-03-21: qty 1

## 2012-03-21 SURGICAL SUPPLY — 35 items
ADH SKN CLS APL DERMABOND .7 (GAUZE/BANDAGES/DRESSINGS) ×2
CANISTER SUCTION 2500CC (MISCELLANEOUS) ×4 IMPLANT
CATH ROBINSON RED A/P 16FR (CATHETERS) ×4 IMPLANT
CLOTH BEACON ORANGE TIMEOUT ST (SAFETY) ×4 IMPLANT
CONTAINER PREFILL 10% NBF 60ML (FORM) ×8 IMPLANT
DERMABOND ADVANCED (GAUZE/BANDAGES/DRESSINGS) ×1
DERMABOND ADVANCED .7 DNX12 (GAUZE/BANDAGES/DRESSINGS) ×3 IMPLANT
DRESSING TELFA 8X3 (GAUZE/BANDAGES/DRESSINGS) ×4 IMPLANT
DRSG COVADERM PLUS 2X2 (GAUZE/BANDAGES/DRESSINGS) ×4 IMPLANT
ELECT REM PT RETURN 9FT ADLT (ELECTROSURGICAL) ×4
ELECTRODE REM PT RTRN 9FT ADLT (ELECTROSURGICAL) ×3 IMPLANT
GLOVE BIO SURGEON STRL SZ 6.5 (GLOVE) ×8 IMPLANT
GLOVE BIOGEL PI IND STRL 6.5 (GLOVE) ×3 IMPLANT
GLOVE BIOGEL PI IND STRL 7.0 (GLOVE) ×6 IMPLANT
GLOVE BIOGEL PI INDICATOR 6.5 (GLOVE) ×1
GLOVE BIOGEL PI INDICATOR 7.0 (GLOVE) ×2
GLOVE ECLIPSE 6.0 STRL STRAW (GLOVE) ×8 IMPLANT
GLOVE INDICATOR 7.0 STRL GRN (GLOVE) ×4 IMPLANT
GOWN PREVENTION PLUS LG XLONG (DISPOSABLE) ×8 IMPLANT
GOWN STRL REIN XL XLG (GOWN DISPOSABLE) ×12 IMPLANT
LOOP ANGLED CUTTING 22FR (CUTTING LOOP) IMPLANT
NEEDLE INSUFFLATION 14GA 120MM (NEEDLE) ×4 IMPLANT
NEEDLE SPNL 22GX3.5 QUINCKE BK (NEEDLE) ×4 IMPLANT
PACK HYSTEROSCOPY LF (CUSTOM PROCEDURE TRAY) ×4 IMPLANT
PACK LAPAROSCOPY BASIN (CUSTOM PROCEDURE TRAY) ×4 IMPLANT
PACK VAGINAL MINOR WOMEN LF (CUSTOM PROCEDURE TRAY) IMPLANT
PAD OB MATERNITY 4.3X12.25 (PERSONAL CARE ITEMS) ×4 IMPLANT
PAD PREP 24X48 CUFFED NSTRL (MISCELLANEOUS) ×4 IMPLANT
SUT VICRYL 0 UR6 27IN ABS (SUTURE) ×4 IMPLANT
SUT VICRYL 4-0 PS2 18IN ABS (SUTURE) ×4 IMPLANT
SYR CONTROL 10ML LL (SYRINGE) ×4 IMPLANT
TOWEL OR 17X24 6PK STRL BLUE (TOWEL DISPOSABLE) ×8 IMPLANT
TROCAR Z-THREAD BLADED 11X100M (TROCAR) ×4 IMPLANT
TROCAR Z-THREAD BLADED 5X100MM (TROCAR) ×4 IMPLANT
WATER STERILE IRR 1000ML POUR (IV SOLUTION) ×4 IMPLANT

## 2012-03-21 NOTE — Anesthesia Preprocedure Evaluation (Signed)
Anesthesia Evaluation  Patient identified by MRN, date of birth, ID band Patient awake    Reviewed: Allergy & Precautions, H&P , Patient's Chart, lab work & pertinent test results, reviewed documented beta blocker date and time   Airway Mallampati: II TM Distance: >3 FB Neck ROM: full    Dental No notable dental hx.    Pulmonary neg pulmonary ROS,  breath sounds clear to auscultation  Pulmonary exam normal       Cardiovascular Exercise Tolerance: Good negative cardio ROS  Rhythm:regular Rate:Normal     Neuro/Psych negative neurological ROS  negative psych ROS   GI/Hepatic negative GI ROS, Neg liver ROS,   Endo/Other  negative endocrine ROSHypothyroidism   Renal/GU negative Renal ROS     Musculoskeletal   Abdominal   Peds  Hematology negative hematology ROS (+)   Anesthesia Other Findings Hypothyroid     Complication of anesthesia 2009 failed epidural, severe pain from second attempt   Reproductive/Obstetrics negative OB ROS                           Anesthesia Physical Anesthesia Plan  ASA: II  Anesthesia Plan: General ETT   Post-op Pain Management:    Induction:   Airway Management Planned:   Additional Equipment:   Intra-op Plan:   Post-operative Plan:   Informed Consent: I have reviewed the patients History and Physical, chart, labs and discussed the procedure including the risks, benefits and alternatives for the proposed anesthesia with the patient or authorized representative who has indicated his/her understanding and acceptance.   Dental Advisory Given  Plan Discussed with: CRNA and Surgeon  Anesthesia Plan Comments:         Anesthesia Quick Evaluation

## 2012-03-21 NOTE — Brief Op Note (Signed)
03/21/2012  10:28 AM  PATIENT:  Jordan [name]  36 y.o. female  PRE-OPERATIVE DIAGNOSIS:  desires sterilization, retained IUD  POST-OPERATIVE DIAGNOSIS:  desires sterilization, retained IUD  PROCEDURE:  Laparoscopic tubal ligation with bipolar cautery, removal of IUD, diagnostic hysteroscopy   SURGEON:  Surgeon(s) and Role:    * Mega Kinkade Cathie Beams, MD - Primary  PHYSICIAN ASSISTANT:   ASSISTANTS: none   ANESTHESIA:   general and paracervical block  EBL:  Total I/O In: 1000 [I.V.:1000] Out: 175 [Urine:150; Blood:25]  BLOOD ADMINISTERED:none  DRAINS: none   LOCAL MEDICATIONS USED:  MARCAINE    and OTHER nesicaine  SPECIMEN:  No Specimen  DISPOSITION OF SPECIMEN:  N/A  COUNTS:  YES  TOURNIQUET:  * No tourniquets in log *  DICTATION: .Other Dictation: Dictation Number [id number]  PLAN OF CARE: Discharge to home after PACU  PATIENT DISPOSITION:  PACU - hemodynamically stable.   Delay start of Pharmacological VTE agent (>24hrs) due to surgical blood loss or risk of bleeding: no 90 cc

## 2012-03-21 NOTE — Preoperative (Signed)
Beta Blockers   Reason not to administer Beta Blockers:Not Applicable 

## 2012-03-21 NOTE — Anesthesia Postprocedure Evaluation (Signed)
Anesthesia Post Note  Patient: Jordan [name]  Procedure(s) Performed: Procedure(s) (LRB): LAPAROSCOPIC TUBAL LIGATION (Bilateral) INTRAUTERINE DEVICE (IUD) REMOVAL (N/A) HYSTEROSCOPY ()  Anesthesia type: GA  Patient location: PACU  Post pain: Pain level controlled  Post assessment: Post-op Vital signs reviewed  Last Vitals:  Filed Vitals:   03/21/12 1030  BP: 109/71  Pulse: 69  Temp: 36.9 C  Resp: 16    Post vital signs: Reviewed  Level of consciousness: sedated  Complications: No apparent anesthesia complications

## 2012-03-21 NOTE — Transfer of Care (Signed)
Immediate Anesthesia Transfer of Care Note  Patient: Jordan [name]  Procedure(s) Performed: Procedure(s) (LRB) with comments: LAPAROSCOPIC TUBAL LIGATION (Bilateral) - with cautery INTRAUTERINE DEVICE (IUD) REMOVAL (N/A) HYSTEROSCOPY ()  Patient Location: PACU  Anesthesia Type: General  Level of Consciousness: awake, alert , oriented and patient cooperative  Airway & Oxygen Therapy: Patient Spontanous Breathing and Patient connected to nasal cannula oxygen  Post-op Assessment: Report given to PACU RN and Post -op Vital signs reviewed and stable  Post vital signs: Reviewed, stable  Complications: No apparent anesthesia complications

## 2012-03-21 NOTE — OR Nursing (Signed)
0830 History and physical on paper on the chart.

## 2012-03-22 ENCOUNTER — Encounter (HOSPITAL_COMMUNITY): Payer: Self-pay | Admitting: Obstetrics and Gynecology

## 2012-03-23 NOTE — Op Note (Signed)
NAMEODEAL, WELDEN NO.:  [id number]  MEDICAL RECORD NO.:  [id number]  LOCATION:  [facility]                          [facility]ACILITY:  [facility]  PHYSICIAN:  Maxie Better, M.D.DATE OF BIRTH:  May 30, 1976  DATE OF PROCEDURE:  03/21/2012 DATE OF DISCHARGE:  03/21/2012                              OPERATIVE REPORT   PREOPERATIVE DIAGNOSIS:  Desires sterilization, retained intrauterine device.  PROCEDURES:  Laparoscopic tubal ligation with bipolar cautery, removal of IUD, diagnostic hysteroscopy.  POSTOPERATIVE DIAGNOSIS:  Desires sterilization, retained intrauterine device.  ANESTHESIA:  General, paracervical block.  SURGEON:  Maxie Better, M.D.  ASSISTANT:  None.  PROCEDURE:  Under adequate general anesthesia, the patient was placed in the dorsal lithotomy position.  She was sterilely prepped and draped in usual fashion.  The bladder was catheterized for moderate amount of urine.  Examination under anesthesia revealed anteverted uterus.  No adnexal masses could be appreciated.  A bivalve speculum was placed in the vagina.  Single-tooth tenaculum was placed on the anterior lip of the cervix.  1% Nesacaine was injected paracervically at the 3 and 9 o'clock position.The IUD string was not seen.  The cervix was dilated with #25 Shawnie Pons dilator.  The ring forceps was then used to explore the uterine cavity.  After several attempts, the IUD was able to be removed intact.  Diagnostic hysteroscope was then inserted.  Both tubal ostia could be seen.  No lesions were noted in the uterine cavity.  The hysteroscope was removed.  An Acorn cannula was introduced into the cervical os and attached to tenaculum for manipulation of the uterus. The bivalve speculum was removed.  Attention was then turned to the abdomen.  0.25% Marcaine was injected infraumbilically.  An infraumbilical incision was then made.  Veress needle was introduced and tested with normal saline.  3 L of CO2 was  subsequently insufflated. Veress needle was then removed.  A 10-mm disposable trocar with sleeve was introduced into the abdomen without incident.  A lighted video laparoscope was placed through that port.  Panoramic inspection was then performed.  Normal liver edge and gallbladder was noted.  The uterus was noted to have some bladder adhesions anteriorly.  Second incision was made suprapubically after 0.25% Marcaine injected into the skin. 5-mm port was placed under direct visualization.  A probe was then utilized to assess the pelvis.  No endometriosis noted in the posterior and anterior cul-de-sac.  The left tube had a distal paratubal cyst.  Right tube was normal.  Both ovaries were normal.  Using the bipolar cautery, the midportion of both fallopian tubes was cauterized, when this was felt to be adequate, the suprapubic site was removed, the abdomen deflated, and the infraumbilical site was removed under direct visualization taking care not to bring up any underlying structures. The incision was then closed with 4-0 Vicryl sutures.  SPECIMENS:  None.  ESTIMATED BLOOD LOSS:  Minimal.  COMPLICATIONS:  None.  INTRAOPERATIVE FLUID:  1 L.  URINE OUTPUT:  150 mL.  Sponge and instrument counts x2 was correct.  The patient tolerated the procedure well, was transferred to the recovery room in a stable condition.     Maxie Better, M.D.  Geraldine/MEDQ  D:  03/22/2012  T:  03/23/2012  Job:  [id number]

## 2012-05-31 ENCOUNTER — Emergency Department (HOSPITAL_COMMUNITY)
Admission: EM | Admit: 2012-05-31 | Discharge: 2012-05-31 | Disposition: A | Discharge status: Home / Self Care | Payer: 59 | Source: Home / Self Care | Attending: Family Medicine | Admitting: Family Medicine

## 2012-05-31 ENCOUNTER — Encounter (HOSPITAL_COMMUNITY): Payer: Self-pay | Admitting: *Deleted

## 2012-05-31 DIAGNOSIS — J40 Bronchitis, not specified as acute or chronic: Secondary | ICD-10-CM

## 2012-05-31 MED ORDER — GUAIFENESIN-CODEINE 100-10 MG/5ML PO SYRP: 5.0000 mL | ORAL_SOLUTION | Freq: Three times a day (TID) | ORAL | Status: DC | PRN

## 2012-05-31 MED ORDER — CETIRIZINE-PSEUDOEPHEDRINE ER 5-120 MG PO TB12: 1.0000 | ORAL_TABLET | Freq: Two times a day (BID) | ORAL | Status: DC

## 2012-05-31 MED ORDER — PREDNISONE 20 MG PO TABS: ORAL_TABLET | ORAL | Status: DC

## 2012-05-31 NOTE — ED Provider Notes (Signed)
History     CSN: [id number]  Arrival date & time 05/31/12  1001   First MD Initiated Contact with Patient 05/31/12 1014      Chief Complaint  Patient presents with  . Cough    (Consider location/radiation/quality/duration/timing/severity/associated sxs/prior treatment) HPI Comments: 36 year old female nonsmoker here complaining of persistent cough and wheezing. Patient states that she was treated for bronchitis about 2 weeks ago with azithromycin. Her symptoms have greatly improved but cough has persisted. She reports dry cough with coughing spells worse at nighttime associated with wheezing. She has used albuterol with transient improvement. No prior history of asthma. No leg swelling or history of hypertension. She's taking Tessalon Perles during daytime with some improvement. Appetite is good. No fever or chills. No pleuritic type of chest pain. No headache. No bloody aches.   Past Medical History  Diagnosis Date  . Hypothyroid   . Complication of anesthesia 2009    failed epidural, severe pain from second attempt    Past Surgical History  Procedure Date  . Cesarean section   . Uterine suspension 2007  . Dilation and curettage of uterus 1995    TAB  . Superficial lymph node biopsy / excision   . Laparoscopic tubal ligation 03/21/2012    Procedure: LAPAROSCOPIC TUBAL LIGATION;  Surgeon: Serita Kyle, MD;  Location: [facility];  Service: Gynecology;  Laterality: Bilateral;  with cautery  . Iud removal 03/21/2012    Procedure: INTRAUTERINE DEVICE (IUD) REMOVAL;  Surgeon: Serita Kyle, MD;  Location: [facility];  Service: Gynecology;  Laterality: N/A;  . Hysteroscopy 03/21/2012    Procedure: HYSTEROSCOPY;  Surgeon: Serita Kyle, MD;  Location: [facility];  Service: Gynecology;;    No family history on file.  History  Substance Use Topics  . Smoking status: Never Smoker   . Smokeless tobacco: Not on file  . Alcohol Use: No    OB History    Grav Para Term  Preterm Abortions TAB SAB Ect Mult Living                  Review of Systems  Constitutional: Negative for fever, chills, diaphoresis, activity change, appetite change, fatigue and unexpected weight change.  HENT: Negative for congestion, sore throat, rhinorrhea, trouble swallowing and sinus pressure.   Eyes: Negative for discharge.  Respiratory: Positive for cough and wheezing.   Cardiovascular: Negative for chest pain, palpitations and leg swelling.  Gastrointestinal: Negative for nausea, vomiting, abdominal pain and diarrhea.  Skin: Negative for rash.  Neurological: Negative for dizziness and headaches.    Allergies  Review of patient's allergies indicates no known allergies.  Home Medications   Current Outpatient Rx  Name  Route  Sig  Dispense  Refill  . ACETAMINOPHEN 325 MG PO TABS   Oral   Take 650 mg by mouth daily as needed. Carpel tunnel pain         . CETIRIZINE-PSEUDOEPHEDRINE ER 5-120 MG PO TB12   Oral   Take 1 tablet by mouth 2 (two) times daily.   30 tablet   0   . GUAIFENESIN-CODEINE 100-10 MG/5ML PO SYRP   Oral   Take 5 mLs by mouth 3 (three) times daily as needed for cough.   120 mL   0   . HYDROCODONE-ACETAMINOPHEN 5-500 MG PO TABS   Oral   Take 1 tablet by mouth every 6 (six) hours as needed for pain.   30 tablet   0   . IBUPROFEN 200 MG  PO TABS   Oral   Take 400 mg by mouth daily as needed. During menstrual cycle         . IBUPROFEN 800 MG PO TABS   Oral   Take 1 tablet (800 mg total) by mouth every 8 (eight) hours as needed for pain.   30 tablet   2   . LEVOTHYROXINE SODIUM 100 MCG PO TABS   Oral   Take 100 mcg by mouth daily.         Marland Kitchen MELATONIN 10 MG PO TABS   Oral   Take 1 tablet by mouth at bedtime as needed. For insomnia         . NYSTATIN 100000 UNIT/GM EX OINT   Topical   Apply 1 application topically daily as needed. For skin irritation         . PREDNISONE 20 MG PO TABS      2 tabs by mouth daily for 5 days    10 tablet   0   . TRAMADOL HCL 50 MG PO TABS   Oral   Take 50-100 mg by mouth 2 (two) times daily as needed. For pain           BP 134/90  Pulse 92  Temp 98.6 F (37 C) (Oral)  Resp 18  SpO2 100%  LMP 05/26/2012  Physical Exam  Nursing note and vitals reviewed. Constitutional: She is oriented to person, place, and time. She appears well-developed and well-nourished. No distress.  HENT:  Head: Normocephalic and atraumatic.  Right Ear: External ear normal.  Left Ear: External ear normal.  Nose: Nose normal.  Mouth/Throat: Oropharynx is clear and moist. No oropharyngeal exudate.  Eyes: Conjunctivae normal are normal. Right eye exhibits no discharge. Left eye exhibits no discharge.  Neck: Neck supple. No JVD present. No thyromegaly present.  Cardiovascular: Normal rate, regular rhythm and normal heart sounds.  Exam reveals no gallop and no friction rub.   No murmur heard. Pulmonary/Chest: Effort normal and breath sounds normal. No respiratory distress. She has no wheezes. She has no rales. She exhibits no tenderness.  Lymphadenopathy:    She has no cervical adenopathy.  Neurological: She is alert and oriented to person, place, and time.  Skin: No rash noted. She is not diaphoretic.    ED Course  Procedures (including critical care time)  Labs Reviewed - No data to display No results found.   1. Bronchitis       MDM  Lungs are clear on exam. Impress hyperreactive airways after recent infection. Prescribed prednisone, cetirizine/pseudoephedrine and Robitussin-AC. Asked to continue to use albuterol and Tessalon Perles during daytime for cough. Patient was asked to return if new onset of fever or worsening breathing, chest pain or persistent not improving symptoms.        Sharin Grave, MD 06/01/12 [phone]

## 2012-05-31 NOTE — ED Notes (Signed)
Pt  reports  Symptoms  Of  Cough   /  Congested        Tightness  In      Chest        Symptoms    X         10  Days     -    Symptoms         X    10  Days          Pt  Took             z  Pack         And  Albuterol           And  Continues  To  Have  Symptoms           She  denys  Any history  Of  Asthma        She is  Alert  And  Oriented

## 2012-10-12 ENCOUNTER — Encounter: Payer: Self-pay | Admitting: Certified Nurse Midwife

## 2012-10-12 ENCOUNTER — Ambulatory Visit (INDEPENDENT_AMBULATORY_CARE_PROVIDER_SITE_OTHER): Payer: 59 | Admitting: Certified Nurse Midwife

## 2012-10-12 VITALS — BP 100/62 | HR 68 | Resp 16

## 2012-10-12 DIAGNOSIS — T192XXA Foreign body in vulva and vagina, initial encounter: Secondary | ICD-10-CM

## 2012-10-12 NOTE — Progress Notes (Signed)
37 yo maaf g3 p2012 here complaining of lost tampon.  Had sexual activity last pm with tampon in and has not be able to remove or locate tampon. Patient still on menses today, with moderate flow, has been wearing pads.  Denies pelvic or abdominal pain or excessive bleeding or fever or chills.  No other health issues today.  O: Healthy female WD WN Affect: normal, orientation x 3  Exam: Skin: warm and dry Pelvic exam: External genital area: normal no lesions BUS: negative Vagina: blood present, inspected with speculum use first, then with sweep of fingers, and with patient with legs elevated and no tampon noted. Re inspected per patient request, findings the same with B.Ann Maki, CMA observing also. Cervix: normal appearance with blood present, non-tender Uterus: normal anteverted, non-tender Adnexa: normal no masses Perineal area: no lesions  A: Normal pelvic exam No retained tampon found  P: Reviewed findings with patient, may have passed in toilet, but no tampon noted. Reassured  Rv prn  Reviewed, TL

## 2012-11-02 ENCOUNTER — Telehealth: Payer: Self-pay | Admitting: Endocrinology

## 2012-11-02 NOTE — Telephone Encounter (Signed)
ok 

## 2012-11-02 NOTE — Telephone Encounter (Signed)
Pt wants to establish care w/ Dr. Elvera Lennox for thyroid. She has seen Dr. Everardo All in the past. Is it OK to transfer care to Dr. Elvera Lennox. CB# [phone] / Roanna Raider

## 2012-11-25 ENCOUNTER — Ambulatory Visit (INDEPENDENT_AMBULATORY_CARE_PROVIDER_SITE_OTHER): Payer: 59 | Admitting: Internal Medicine

## 2012-11-25 ENCOUNTER — Encounter: Payer: Self-pay | Admitting: Internal Medicine

## 2012-11-25 VITALS — BP 128/78 | HR 95 | Temp 98.2°F | Ht 65.0 in | Wt 197.0 lb

## 2012-11-25 DIAGNOSIS — Z862 Personal history of diseases of the blood and blood-forming organs and certain disorders involving the immune mechanism: Secondary | ICD-10-CM

## 2012-11-25 DIAGNOSIS — E039 Hypothyroidism, unspecified: Secondary | ICD-10-CM

## 2012-11-25 DIAGNOSIS — Z8639 Personal history of other endocrine, nutritional and metabolic disease: Secondary | ICD-10-CM

## 2012-11-25 MED ORDER — SYNTHROID 150 MCG PO TABS: 150.0000 ug | ORAL_TABLET | Freq: Every day | ORAL | Status: DC

## 2012-11-25 NOTE — Progress Notes (Signed)
Subjective:     Patient ID: Jordan [name], female   DOB: Apr 11, 1976, 37 y.o.   MRN: [id number]  HPI Jordan [name] is a pleasant 37 y/o, self-referred for management of hypothyroidism.  She was dx with overactive thyroid Graves Ds. in 37, by Dr. Everardo All and had RAI ablation then. She was then followed by him for few years, after which she moved out of the area and she was monitored somewhere else. She returned to [facility] in 2006. Since then, she was followed by another endocrinologist who retired, then another endocrinologist in [place], at [facility]   She is now taking brand name Synthroid 6 mo ago, after last check. She takes Synthroid in am, with water. She is out of meds since 2 days ago. She mentions her face starts swelling if she does not take her Synthroid every day or she takes it later in the day. She eats >1h after dosing. Occasionally she takes MVI after b'fast.  She c/o fatigue, has had anemia around her 2 pregnancies. No recent CBC checks. She denies thyroid nodules. No dysphagia, hoarseness, SOB with lying down. She has occasional palpitations recently. No anxiety. She has night sweats. No hyperdefecation. No irregular menstrual cycles.   She has a FH of thyroid ds: Graves in brother, PGM, other more distant family. Mother has RA. Cousin has celiac ds.   Review of Systems Constitutional: + weight gain, + fatigue, no subjective hyperthermia/hypothermia, + poor sleep Eyes: no blurry vision, no xerophthalmia ENT: no sore throat, no nodules palpated in throat, no dysphagia/odynophagia, no hoarseness Cardiovascular: no CP/SOB/+ palpitations/+ leg swelling Respiratory: no cough/SOB Gastrointestinal: no N/V/D/C Musculoskeletal: no muscle/+ joint aches/swelling Skin: no rashes, + easy bruising Neurological: no tremors/numbness/tingling/dizziness Psychiatric: no depression/anxiety Low libido   Past Medical History  Diagnosis Date  . Hypothyroid   . Complication of  anesthesia 2009    failed epidural, severe pain from second attempt  . HSV-2 infection    Past Surgical History  Procedure Laterality Date  . Cesarean section    . Uterine suspension  2007  . Dilation and curettage of uterus  1995    TAB  . Superficial lymph node biopsy / excision    . Laparoscopic tubal ligation  03/21/2012    Procedure: LAPAROSCOPIC TUBAL LIGATION;  Surgeon: Serita Kyle, MD;  Location: [facility];  Service: Gynecology;  Laterality: Bilateral;  with cautery  . Iud removal  03/21/2012    Procedure: INTRAUTERINE DEVICE (IUD) REMOVAL;  Surgeon: Serita Kyle, MD;  Location: [facility];  Service: Gynecology;  Laterality: N/A;  . Hysteroscopy  03/21/2012    Procedure: HYSTEROSCOPY;  Surgeon: Serita Kyle, MD;  Location: [facility];  Service: Gynecology;;  . Tubal ligation     History   Social History  . Marital Status: Married    Spouse Name: N/A    Number of Children: 2: ages 64 and 46 y/o   Occupational History  . HR manager   Social History Main Topics  . Smoking status: Never Smoker   . Smokeless tobacco: Not on file  . Alcohol Use: Yes, wine     Comment: occasional, 1-2 per month  . Drug Use: No  . Sexually Active: Yes -- Female partner(s)    Birth Control/ Protection: None  No Known Allergies  Meds:  Synthroid 150 mcg daily  Family History  Problem Relation Age of Onset  . Hypertension Mother   . Hypertension Father   . Breast cancer Paternal Aunt   .  Breast cancer Maternal Aunt   Also, diabetes in paternal aunt, hyperlipidemia in both parents.  Pt sees Dr. Maxie Better and Dr. Genice Rouge ([facility])    Objective:   Physical Exam BP 128/78  Pulse 95  Temp(Src) 98.2 F (36.8 C) (Oral)  Ht 5\' 5"  (1.651 m)  Wt 197 lb (89.359 kg)  BMI 32.78 kg/m2  SpO2 97% Wt Readings from Last 3 Encounters:  11/25/12 197 lb (89.359 kg)  03/11/12 191 lb (86.637 kg)  10/27/07 180 lb 3.2 oz (81.738 kg)   Constitutional: slightly overweight, in  NAD Eyes: PERRLA, EOMI, no exophthalmos ENT: moist mucous membranes, no thyromegaly, no cervical lymphadenopathy Cardiovascular: RRR, No MRG Respiratory: CTA B Gastrointestinal: abdomen soft, NT, ND, BS+ Musculoskeletal: no deformities, strength intact in all 4 Skin: moist, warm, no rashes Neurological: no tremor with outstretched hands, DTR normal in all 4  Assessment:     1. 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  - we will check TFTs today - I refilled few Synthroid tablets since she is completely out, more when tests are back - We discussed correct intake of thyroid hormones, separated by >30 min from breakfast, and >4h from PPI, calcium, iron, MVI. - will refer to establish care with a PCP ([place]) - Will schedule a new appt in 6 months  - I advised her to join MyChart to communicate easier  Office Visit on 11/25/2012  Component Date Value Range Status  . TSH 11/25/2012 9.98* 0.35 - 5.50 uIU/mL Final  . Free T4 11/25/2012 0.63  0.60 - 1.60 ng/dL Final   Called pt to start 175 mcg Synthroid daily. RTC for labs in 6-8 weeks.

## 2012-11-25 NOTE — Patient Instructions (Signed)
Please join MyChart. Please return in 6 months.

## 2012-11-28 LAB — T4, FREE: Free T4: 0.63 ng/dL (ref 0.60–1.60)

## 2012-11-29 MED ORDER — SYNTHROID 175 MCG PO TABS: 175.0000 ug | ORAL_TABLET | Freq: Every day | ORAL | Status: DC

## 2013-03-03 ENCOUNTER — Ambulatory Visit (INDEPENDENT_AMBULATORY_CARE_PROVIDER_SITE_OTHER): Payer: 59 | Admitting: Internal Medicine

## 2013-03-03 ENCOUNTER — Encounter: Payer: Self-pay | Admitting: Internal Medicine

## 2013-03-03 VITALS — BP 122/76 | HR 87 | Temp 99.1°F | Resp 10 | Wt 203.0 lb

## 2013-03-03 DIAGNOSIS — E039 Hypothyroidism, unspecified: Secondary | ICD-10-CM

## 2013-03-03 LAB — TSH: TSH: 0.58 u[IU]/mL (ref 0.35–5.50)

## 2013-03-03 MED ORDER — SYNTHROID 175 MCG PO TABS: 175.0000 ug | ORAL_TABLET | Freq: Every day | ORAL | Status: DC

## 2013-03-03 NOTE — Patient Instructions (Addendum)
Please return in 6 months. Please stop at the lab, write your name on the list, take a seat in the waiting room, and you will be called in for blood draw.

## 2013-03-03 NOTE — Progress Notes (Signed)
Subjective:     Patient ID: Jordan [name], female   DOB: [birth year], 37 y.o.   MRN: [id number]  HPI Jordan [name] is a pleasant 37 y/o, returning for followup for hypothyroidism post RAI ablation for Graves' disease in 1997. Last visit 3 mo ago.   She is on brand name Synthroid 175 mcg daily, increased from 150 at last visit, after TSH returned high at: Lab Results  Component Value Date   TSH 9.98* 11/25/2012  Pt did not return for labs in 6 weeks after the increase in dose.  She takes Synthroid in am, with water. She takes a MVI at night. She eats >1h after dosing.   Recently had irregular menstrual cycles - q 2 weeks. She c/o fatigue, has had anemia around her 2 pregnancies. She denies thyroid nodules. No dysphagia, hoarseness, SOB with lying down. She has occasional palpitations recently. No anxiety. She has night sweats. No hyperdefecation.   I reviewed pt's medications, allergies, PMH, social hx, family hx and no changes required, except as mentioned above. Pt sees Dr. Maxie Better and Dr. Genice Rouge ([facility])  Review of Systems Constitutional: + weight gain, + fatigue, no subjective hyperthermia/hypothermia, + poor sleep Eyes: no blurry vision, no xerophthalmia ENT: no sore throat, no nodules palpated in throat, no dysphagia/odynophagia, no hoarseness Cardiovascular: no CP/SOB/+ palpitations/+ leg swelling Respiratory: no cough/SOB Gastrointestinal: no N/V/D/C Musculoskeletal: no muscle/+ joint aches Skin: no rashes, + easy bruising + menstrual cycle irregularity  I reviewed pt's medications, allergies, PMH, social hx, family hx and no changes required, except as mentioned above.  Objective:   Physical Exam BP 122/76  Pulse 87  Temp(Src) 99.1 F (37.3 C) (Oral)  Resp 10  Wt 203 lb (92.08 kg)  BMI 33.78 kg/m2  SpO2 99% Wt Readings from Last 3 Encounters:  03/03/13 203 lb (92.08 kg)  11/25/12 197 lb (89.359 kg)  03/11/12 191 lb (86.637 kg)   Constitutional: slightly  overweight, in NAD Eyes: PERRLA, EOMI, no exophthalmos ENT: moist mucous membranes, no thyromegaly, no cervical lymphadenopathy Cardiovascular: RRR, + SEM 2/6, no RG Respiratory: CTA B Gastrointestinal: abdomen soft, NT, ND, BS+ Musculoskeletal: no deformities, strength intact in all 4 Skin: moist, warm, no rashes Neurological: no tremor with outstretched hands, DTR decreased in all 4  Assessment:     1. 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  - we will check TFTs today, will send her the results through MyChart - she needs a refill on Synthroid, but will do this after the results of the tests are back - We discussed correct intake of thyroid hormones, separated by >30 min from breakfast, and >4h from PPI, calcium, iron, MVI. - Will schedule a new appt in 6 months  - pt will schedule an appt with Dr Cherly Hensen for her irregular menstrual cycles  Office Visit on 03/03/2013  Component Date Value Range Status  . TSH 03/03/2013 0.58  0.35 - 5.50 uIU/mL Final  . Free T4 03/03/2013 1.01  0.60 - 1.60 ng/dL Final   Will call in more Synthroid 175.

## 2013-04-27 ENCOUNTER — Other Ambulatory Visit: Payer: Self-pay

## 2013-09-05 ENCOUNTER — Emergency Department (HOSPITAL_COMMUNITY)
Admission: EM | Admit: 2013-09-05 | Discharge: 2013-09-05 | Disposition: A | Discharge status: Home / Self Care | Payer: 59 | Source: Home / Self Care

## 2013-09-05 ENCOUNTER — Encounter (HOSPITAL_COMMUNITY): Payer: Self-pay | Admitting: Emergency Medicine

## 2013-09-05 ENCOUNTER — Ambulatory Visit: Payer: 59 | Admitting: Certified Nurse Midwife

## 2013-09-05 DIAGNOSIS — J309 Allergic rhinitis, unspecified: Secondary | ICD-10-CM

## 2013-09-05 MED ORDER — FLUTICASONE PROPIONATE 50 MCG/ACT NA SUSP: 2.0000 | Freq: Every day | NASAL | Status: DC

## 2013-09-05 NOTE — Discharge Instructions (Signed)
Allergic Rhinitis Try allegra 180 mg a day flonase nasal spray Lots of nasal saline Sudafed PE 10 mg every 4-6 hr as needed At night for added symptom relief try Chlor Trimeton tablets, may cause drowsiness.  Allergic rhinitis is when the mucous membranes in the nose respond to allergens. Allergens are particles in the air that cause your body to have an allergic reaction. This causes you to release allergic antibodies. Through a chain of events, these eventually cause you to release histamine into the blood stream. Although meant to protect the body, it is this release of histamine that causes your discomfort, such as frequent sneezing, congestion, and an itchy, runny nose.  CAUSES  Seasonal allergic rhinitis (hay fever) is caused by pollen allergens that may come from grasses, trees, and weeds. Year-round allergic rhinitis (perennial allergic rhinitis) is caused by allergens such as house dust mites, pet dander, and mold spores.  SYMPTOMS   Nasal stuffiness (congestion).  Itchy, runny nose with sneezing and tearing of the eyes. DIAGNOSIS  Your health care provider can help you determine the allergen or allergens that trigger your symptoms. If you and your health care provider are unable to determine the allergen, skin or blood testing may be used. TREATMENT  Allergic Rhinitis does not have a cure, but it can be controlled by:  Medicines and allergy shots (immunotherapy).  Avoiding the allergen. Hay fever may often be treated with antihistamines in pill or nasal spray forms. Antihistamines block the effects of histamine. There are over-the-counter medicines that may help with nasal congestion and swelling around the eyes. Check with your health care provider before taking or giving this medicine.  If avoiding the allergen or the medicine prescribed do not work, there are many new medicines your health care provider can prescribe. Stronger medicine may be used if initial measures are  ineffective. Desensitizing injections can be used if medicine and avoidance does not work. Desensitization is when a patient is given ongoing shots until the body becomes less sensitive to the allergen. Make sure you follow up with your health care provider if problems continue. HOME CARE INSTRUCTIONS It is not possible to completely avoid allergens, but you can reduce your symptoms by taking steps to limit your exposure to them. It helps to know exactly what you are allergic to so that you can avoid your specific triggers. SEEK MEDICAL CARE IF:   You have a fever.  You develop a cough that does not stop easily (persistent).  You have shortness of breath.  You start wheezing.  Symptoms interfere with normal daily activities. Document Released: 03/03/2001 Document Revised: 03/29/2013 Document Reviewed: 02/13/2013 [facility] Patient Information 2014 [place].

## 2013-09-05 NOTE — ED Provider Notes (Signed)
CSN: [id number]     Arrival date & time 09/05/13  7106 History   First MD Initiated Contact with Patient 09/05/13 [phone]     Chief Complaint  Patient presents with  . URI   (Consider location/radiation/quality/duration/timing/severity/associated sxs/prior Treatment) HPI Comments: Was outside about 4 d ago and experienced watery eyes, nasal congestion, PND, cough...she st are her usual allergy sx's.  The OTC claritin not working   Past Medical History  Diagnosis Date  . Hypothyroid   . Complication of anesthesia 2009    failed epidural, severe pain from second attempt  . HSV-2 infection    Past Surgical History  Procedure Laterality Date  . Cesarean section    . Uterine suspension  2007  . Dilation and curettage of uterus  1995    TAB  . Superficial lymph node biopsy / excision    . Laparoscopic tubal ligation  03/21/2012    Procedure: LAPAROSCOPIC TUBAL LIGATION;  Surgeon: Marvene Staff, MD;  Location: [facility];  Service: Gynecology;  Laterality: Bilateral;  with cautery  . Iud removal  03/21/2012    Procedure: INTRAUTERINE DEVICE (IUD) REMOVAL;  Surgeon: Marvene Staff, MD;  Location: [facility];  Service: Gynecology;  Laterality: N/A;  . Hysteroscopy  03/21/2012    Procedure: HYSTEROSCOPY;  Surgeon: Marvene Staff, MD;  Location: [facility];  Service: Gynecology;;  . Tubal ligation     Family History  Problem Relation Age of Onset  . Hypertension Mother   . Hypertension Father   . Breast cancer Paternal Aunt   . Breast cancer Maternal Aunt    History  Substance Use Topics  . Smoking status: Never Smoker   . Smokeless tobacco: Not on file  . Alcohol Use: Yes     Comment: occasiona,l 1 time every 3 mths   OB History   Grav Para Term Preterm Abortions TAB SAB Ect Mult Living   3 2 2  1 1    2      Review of Systems  Constitutional: Negative for fever, chills, activity change, appetite change and fatigue.  HENT: Positive for congestion, postnasal drip,  rhinorrhea and sneezing. Negative for ear pain, facial swelling, sinus pressure, sore throat and trouble swallowing.   Eyes: Negative.   Respiratory: Positive for cough. Negative for shortness of breath.   Cardiovascular: Negative.  Negative for chest pain.  Gastrointestinal: Negative.   Genitourinary: Negative.   Musculoskeletal: Negative for neck pain and neck stiffness.  Skin: Negative for pallor and rash.  Neurological: Negative.     Allergies  Review of patient's allergies indicates no known allergies.  Home Medications   Current Outpatient Rx  Name  Route  Sig  Dispense  Refill  . fluticasone (FLONASE) 50 MCG/ACT nasal spray   Each Nare   Place 2 sprays into both nostrils daily.   16 g   2   . SYNTHROID 175 MCG tablet   Oral   Take 1 tablet (175 mcg total) by mouth daily before breakfast.   90 tablet   2     Dispense as written.    BP 127/81  Pulse 78  Temp(Src) 98.2 F (36.8 C) (Oral)  Resp 18  SpO2 98%  LMP 07/04/2013 Physical Exam  Nursing note and vitals reviewed. Constitutional: She is oriented to person, place, and time. She appears well-developed and well-nourished. No distress.  HENT:  Right Ear: External ear normal.  Left Ear: External ear normal.  Mouth/Throat: Oropharynx is clear and moist.  Clear PND  Eyes: EOM are normal.  Mild bilateral conjunctival redness.  Neck: Normal range of motion. Neck supple.  Cardiovascular: Normal rate, regular rhythm and normal heart sounds.   Pulmonary/Chest: Effort normal and breath sounds normal. No respiratory distress. She has no wheezes. She has no rales.  Musculoskeletal: She exhibits no edema.  Lymphadenopathy:    She has no cervical adenopathy.  Neurological: She is alert and oriented to person, place, and time. She exhibits normal muscle tone.  Skin: Skin is warm and dry. No rash noted.    ED Course  Procedures (including critical care time) Labs Review Labs Reviewed - No data to display Imaging  Review No results found.   MDM   1. Allergic rhinitis    Allegra 180 mg flonase ns Saline nasal spray Sudafed PE 10 mg as dir Chlor trimeton at hs prn sx's.     Janne Napoleon, NP 09/05/13 [phone]

## 2013-09-05 NOTE — ED Provider Notes (Signed)
Medical screening examination/treatment/procedure(s) were performed by resident physician or non-physician practitioner and as supervising physician I was immediately available for consultation/collaboration.   Pauline Good MD.   Billy Fischer, MD 09/05/13 1116

## 2013-09-05 NOTE — ED Notes (Signed)
C/o symptoms starting 3/13 with eyes watery, head, nasal congestion.  Spent a lot of time outside yesterday.  Now having chest congestion, last night felt sob-wheezing.  Reports coughing up yellow phlegm, blowing yellow/green from sinus

## 2013-09-11 ENCOUNTER — Ambulatory Visit (INDEPENDENT_AMBULATORY_CARE_PROVIDER_SITE_OTHER): Payer: 59 | Admitting: Certified Nurse Midwife

## 2013-09-11 ENCOUNTER — Encounter: Payer: Self-pay | Admitting: Certified Nurse Midwife

## 2013-09-11 VITALS — BP 120/87 | HR 80 | Resp 16 | Ht 65.25 in | Wt 198.0 lb

## 2013-09-11 DIAGNOSIS — IMO0002 Reserved for concepts with insufficient information to code with codable children: Secondary | ICD-10-CM

## 2013-09-11 DIAGNOSIS — Z01419 Encounter for gynecological examination (general) (routine) without abnormal findings: Secondary | ICD-10-CM

## 2013-09-11 DIAGNOSIS — Z0000 Encounter for general adult medical examination without abnormal findings: Secondary | ICD-10-CM

## 2013-09-11 NOTE — Patient Instructions (Signed)

## 2013-09-11 NOTE — Progress Notes (Signed)
Reviewed personally.  M. Suzanne Ahmad Vanwey, MD.  

## 2013-09-11 NOTE — Progress Notes (Signed)
38 y.o. [id number] Married African American Fe here for annual exam.  Periods for past 3 months have been anywhere from 14 to 20 days. Last period 20 day cycle. When patient has 14 day cycle it lasts 3 days also. Patient concerned that this is not normal. Sees Endocrine for Hypothyroid management. Patient has been seen at [facility] for work up of irregular cycles. She had PUS with small fibroid noted, SHG with questionable results per patient. Had labs of Prolactin ? Normal. Patient very frustrated with not being resolved. She went there because they provided OB care for her in past. Sees PCP prn. Patient complaining of pain with sexual activity "at times". Denies vaginal dryness or decrease lubrication. No other health issues today. Busy with 57,57 year old children.  Patient's last menstrual period was 09/05/2013.          Sexually active: yes  The current method of family planning is tubal ligation.    Exercising: no  exercise Smoker:  no  Health Maintenance: Pap:  2011 neg MMG:  2007 Colonoscopy: none BMD:   none TDaP:  2009 Labs: none Self breast exam:done monthly   reports that she has never smoked. She does not have any smokeless tobacco history on file. She reports that she does not drink alcohol or use illicit drugs.  Past Medical History  Diagnosis Date  . Complication of anesthesia 2009    failed epidural, severe pain from second attempt  . HSV-2 infection   . Hypothyroid     graves    Past Surgical History  Procedure Laterality Date  . Cesarean section    . Uterine suspension  2007  . Dilation and curettage of uterus  1995    TAB  . Superficial lymph node biopsy / excision    . Laparoscopic tubal ligation  03/21/2012    Procedure: LAPAROSCOPIC TUBAL LIGATION;  Surgeon: Marvene Staff, MD;  Location: [facility];  Service: Gynecology;  Laterality: Bilateral;  with cautery  . Iud removal  03/21/2012    Procedure: INTRAUTERINE DEVICE (IUD) REMOVAL;  Surgeon: Marvene Staff, MD;  Location: [facility];  Service: Gynecology;  Laterality: N/A;  . Hysteroscopy  03/21/2012    Procedure: HYSTEROSCOPY;  Surgeon: Marvene Staff, MD;  Location: [facility];  Service: Gynecology;;  . Tubal ligation      Current Outpatient Prescriptions  Medication Sig Dispense Refill  . fluticasone (FLONASE) 50 MCG/ACT nasal spray Place 2 sprays into both nostrils daily.  16 g  2  . ibuprofen (ADVIL,MOTRIN) 600 MG tablet as needed.      Marland Kitchen SYNTHROID 175 MCG tablet Take 1 tablet (175 mcg total) by mouth daily before breakfast.  90 tablet  2   No current facility-administered medications for this visit.    Family History  Problem Relation Age of Onset  . Hypertension Mother   . Hypertension Father   . Breast cancer Paternal Aunt   . Thyroid disease Paternal Aunt   . Breast cancer Maternal Aunt   . Thyroid disease Brother   . Stroke Maternal Grandmother   . Stroke Paternal Grandmother   . Thyroid disease Paternal Grandmother   . Thyroid disease Paternal Aunt     ROS:  Pertinent items are noted in HPI.  Otherwise, a comprehensive ROS was negative.  Exam:   BP 120/87  Pulse 80  Resp 16  Ht 5' 5.25" (1.657 m)  Wt 198 lb (89.812 kg)  BMI 32.71 kg/m2  LMP 09/05/2013  Height: 5' 5.25" (165.7 cm)  Ht Readings from Last 3 Encounters:  09/11/13 5' 5.25" (1.657 m)  11/25/12 5\' 5"  (1.651 m)  03/11/12 5\' 5"  (1.651 m)    General appearance: alert, cooperative and appears stated age Head: Normocephalic, without obvious abnormality, atraumatic Neck: no adenopathy, supple, symmetrical, trachea midline and thyroid normal to inspection and palpation and non-palpable Lungs: clear to auscultation bilaterally Breasts: normal appearance, no masses or tenderness, No nipple retraction or dimpling, No nipple discharge or bleeding, No axillary or supraclavicular adenopathy Heart: regular rate and rhythm Abdomen: soft, non-tender; no masses,  no organomegaly Extremities: extremities  normal, atraumatic, no cyanosis or edema Skin: Skin color, texture, turgor normal. No rashes or lesions Lymph nodes: Cervical, supraclavicular, and axillary nodes normal. No abnormal inguinal nodes palpated Neurologic: Grossly normal   Pelvic: External genitalia:  no lesions              Urethra:  normal appearing urethra with no masses, tenderness or lesions              Bartholin's and Skene's: normal                 Vagina: normal appearing vagina with normal color and discharge, no lesions, no pain noted with vaginal exam              Cervix: non tender, normal              Pap taken: yes Bimanual Exam:  Uterus:  normal size, contour, position, consistency, mobility, non-tender and mid position,              Adnexa: normal adnexa and no mass, fullness, tenderness               Rectovaginal: Confirms               Anus:  normal sphincter tone, no lesions  A:  Well Woman with normal exam  Contraception BTL  Irregular cycle pattern? Vs ovulatory bleeding vs fibroid spotting( history of fibroid)  Dyspareunia   Hypothyroid with Endocrine management stable medication per patient  P:   Reviewed health and wellness pertinent to exam  Given menses calendar with normal/abnormal parameters to keep and advise of next menses cycle pattern.Records requested from other practice. Discussed with patient that weight can play a role in irregular cycles if all other parameters normal. Discussed cycle control options once the other records have been reviewed and no further evaluation needed. Patient will consider. Encouraged regular exercise, which helps with cycle regulation as well with decreasing health risks. If records not in to view in two weeks patient will obtain.  Discussed changing position for sexual activity to see if this resolves the problem. Also using lubrication as a trial to see if this decreases sensitivity. Patient plans to try. Questions addressed.  Pap smear as per  guidelines   Mammogram at 40 pap smear taken today with HPVHR  counseled on breast self exam, adequate intake of calcium and vitamin D, diet and exercise  return annually or prn as above  An After Visit Summary was printed and given to the patient.

## 2013-09-14 ENCOUNTER — Ambulatory Visit: Payer: Self-pay | Admitting: Internal Medicine

## 2013-09-15 LAB — IPS PAP TEST WITH HPV

## 2013-11-07 ENCOUNTER — Telehealth: Payer: Self-pay

## 2013-11-07 NOTE — Telephone Encounter (Signed)
Returning a call to [name] °

## 2013-11-07 NOTE — Telephone Encounter (Signed)
Message copied by Robley Fries on Tue Nov 07, 2013 10:51 AM ------      Message from: Regina Eck      Created: Tue Nov 07, 2013  8:15 AM       Please call patient and let her know we have yet to receive records from her previous OB-GYN regarding fibroids. Can she help with this? See previous chart note of GYN visit      Debbi  ------

## 2013-11-07 NOTE — Telephone Encounter (Signed)
Lmtcb//kn 

## 2013-12-05 NOTE — Telephone Encounter (Signed)
Lmtcb//kn 

## 2013-12-12 ENCOUNTER — Other Ambulatory Visit: Payer: Self-pay | Admitting: Certified Nurse Midwife

## 2013-12-25 NOTE — Telephone Encounter (Signed)
Letter mailed re: outside records//kn

## 2014-03-15 ENCOUNTER — Ambulatory Visit (INDEPENDENT_AMBULATORY_CARE_PROVIDER_SITE_OTHER): Payer: 59 | Admitting: Certified Nurse Midwife

## 2014-03-15 ENCOUNTER — Encounter: Payer: Self-pay | Admitting: Certified Nurse Midwife

## 2014-03-15 ENCOUNTER — Telehealth: Payer: Self-pay | Admitting: Certified Nurse Midwife

## 2014-03-15 VITALS — BP 112/66 | HR 78 | Resp 12 | Ht 65.25 in | Wt 206.0 lb

## 2014-03-15 DIAGNOSIS — N6320 Unspecified lump in the left breast, unspecified quadrant: Secondary | ICD-10-CM

## 2014-03-15 DIAGNOSIS — N63 Unspecified lump in unspecified breast: Secondary | ICD-10-CM

## 2014-03-15 DIAGNOSIS — N644 Mastodynia: Secondary | ICD-10-CM

## 2014-03-15 NOTE — Progress Notes (Signed)
   Subjective:   38 y.o. Married [place] American female presents for evaluation of left breast mass and tenderness. Onset of the symptoms was one day. Patient sought evaluation because of breast tenderness.  Contributing factors include none. Denies chills, fevers and or injury or tight bra use. Patient denies history of trauma, bites, or injuries. Last mammogram was none.  Previous evaluation has included none. Patient was in shower and water touched breast which felt sore( not due for period) and she palpated breast and noted change in feel. No redness or nipple discharge or skin change had been noted. Does SBE. No family history of breast cancer.   Review of Systems Pertinent items are noted in HPI.   Objective:   General appearance: alert, appears stated age and no distress Breasts: normal appearance, no masses or tenderness, No nipple retraction or dimpling, No nipple discharge or bleeding, No axillary or supraclavicular adenopathy, right breast. Left breast small firm area noted at 11-12 o'clock cystic fibroglandular feel, different from other breast tissue,? mass, slight tenderness to touch, no redness or increase warmth noted, no nipple discharge or apparent skin change, no axillarry enlargement or tenderness, no enlarged lymph nodes.    Assessment:   ASSESSMENT:Patient is diagnosed with left breast mass with slight tenderness   Plan:   PLAN:Discussed findings with patient and need for further evaluation with mammogram and [place] if needed for diagnosis. Patient agreeable to plan. Patient will be scheduled and notified of appointment. Patient will advise if any change in tenderness or fever.  Rv prn

## 2014-03-15 NOTE — Telephone Encounter (Signed)
Spoke with patient at time of incoming call.  L Breast Pain that started spontaneously last night.  No trauma or animal bites.  No redness, swelling, or nipple discharge. Patient reports tenderness only.  Offered and scheduled office visit today with Regina Eck CNM for breast check.  Routing to provider for final review. Patient agreeable to disposition. Will close encounter

## 2014-03-15 NOTE — Telephone Encounter (Signed)
Patient calling for an appointment for left breast pain that started yesterday.

## 2014-03-16 ENCOUNTER — Telehealth: Payer: Self-pay | Admitting: Emergency Medicine

## 2014-03-16 DIAGNOSIS — N644 Mastodynia: Secondary | ICD-10-CM

## 2014-03-16 DIAGNOSIS — N6320 Unspecified lump in the left breast, unspecified quadrant: Secondary | ICD-10-CM

## 2014-03-16 NOTE — Telephone Encounter (Signed)
Patient is scheduled for diagnostic bilateral mammogram with L Breast Ultrasound at [facility] imaging on 03/22/14 at 1315.  Patient is notified of appointment.  She is agreeable. Will follow up prn.  Routing to provider for final review. Patient agreeable to disposition. Will close encounter

## 2014-03-16 NOTE — Progress Notes (Signed)
Reviewed personally.  M. Suzanne Sherisa Gilvin, MD.  

## 2014-03-22 ENCOUNTER — Ambulatory Visit
Admission: RE | Admit: 2014-03-22 | Discharge: 2014-03-22 | Disposition: A | Discharge status: Home / Self Care | Payer: 59 | Source: Ambulatory Visit | Attending: Certified Nurse Midwife | Admitting: Certified Nurse Midwife

## 2014-03-22 DIAGNOSIS — N6320 Unspecified lump in the left breast, unspecified quadrant: Secondary | ICD-10-CM

## 2014-03-22 DIAGNOSIS — N644 Mastodynia: Secondary | ICD-10-CM

## 2014-03-27 NOTE — Progress Notes (Signed)
Please call schedule patient to come in for follow up exam

## 2014-04-03 ENCOUNTER — Telehealth: Payer: Self-pay | Admitting: Emergency Medicine

## 2014-04-03 NOTE — Telephone Encounter (Signed)
Patient notified of messages. She is agreeable to come for breast recheck. Agreeable to plan.  Scheduled office visit for 05/03/14 at 1600.  Routing to provider for final review. Patient agreeable to disposition. Will close encounter

## 2014-04-03 NOTE — Telephone Encounter (Signed)
Message left to return call to [place] at [phone].   Will need to schedule office visit with Regina Eck CNM for approximately beginning of November for breast check.

## 2014-04-03 NOTE — Telephone Encounter (Addendum)
Message copied by Michele Mcalpine on Tue Apr 03, 2014  9:32 AM ------      Message from: Megan Salon      Created: Tue Mar 27, 2014 12:24 PM       Sharday Michl--OK to removed from hold      Debbi--I think you should see pt back in a month for recheck.  Olivia Mackie can call pt and schedule.  Thanks. ------   Notes Recorded by Regina Eck, CNM on 03/23/2014 at 8:24 AM Notify patient that I have reviewed mammogram and US findings which supports what we noted in the office. No masses noted. Continue SBE. Follow up visit in office in 2 months unless patient notes change in status. Out of mammogram hold if Dr Sabra Heck in agreement

## 2014-04-23 ENCOUNTER — Encounter: Payer: Self-pay | Admitting: Certified Nurse Midwife

## 2014-05-03 ENCOUNTER — Ambulatory Visit (INDEPENDENT_AMBULATORY_CARE_PROVIDER_SITE_OTHER): Payer: 59 | Admitting: Certified Nurse Midwife

## 2014-05-03 ENCOUNTER — Encounter: Payer: Self-pay | Admitting: Certified Nurse Midwife

## 2014-05-03 VITALS — BP 100/64 | HR 70 | Resp 16 | Ht 65.25 in | Wt 208.0 lb

## 2014-05-03 DIAGNOSIS — N946 Dysmenorrhea, unspecified: Secondary | ICD-10-CM

## 2014-05-03 DIAGNOSIS — N644 Mastodynia: Secondary | ICD-10-CM

## 2014-05-03 MED ORDER — IBUPROFEN 600 MG PO TABS: 600.0000 mg | ORAL_TABLET | Freq: Four times a day (QID) | ORAL | Status: DC | PRN

## 2014-05-03 NOTE — Progress Notes (Signed)
38 y.o. Married African American [phone] here for follow up breast exam from evaluation done on 03/15/14. Patient had complained of breast tenderness and ? Mass in left breast. At that time she was drinking caffeine and "not healthy life style". Patient was evaluated in office and no mass noted, but tenderness. Patient had diagnostic mammogram and [place] which were negative. Patient now denies any tenderness or palpable mass or skin change or nipple discharge in the left breast as before and no changes in right breast. Patient also requesting refill on Motrin for cramping with menses due to cost saving. Patient has not checked with family members on genetic screening, and is aware she could also screen. No health issues today.  O: Healthy female, WD WN Affect: normal orientation X 3   Breast exam: No masses, tenderness, skin change or nipple discharge noted bilateral. Area of tenderness noted previously is non tender to touch.   A: History of left breast tenderness, Normal breast exam bilateral Negative diagnostic mammogram and [place] Family history of breast cancer paternal/maternal aunts  P:Reviewed findings of normal breast exam and continue to eat health and limit caffeine.  Stressed she can do genetic screening if she desires. Patient aware, but plans to check with family. Rx Motrin see order  For dysmenorrhea  RV prn, aex 3/16

## 2014-05-03 NOTE — Patient Instructions (Signed)

## 2014-05-04 NOTE — Progress Notes (Signed)
Reviewed personally.  M. Suzanne Jone Panebianco, MD.  

## 2014-07-07 ENCOUNTER — Other Ambulatory Visit: Payer: Self-pay | Admitting: Internal Medicine

## 2014-07-09 NOTE — Telephone Encounter (Signed)
Needs appt for further refills.

## 2014-09-13 ENCOUNTER — Ambulatory Visit (INDEPENDENT_AMBULATORY_CARE_PROVIDER_SITE_OTHER): Payer: 59 | Admitting: Certified Nurse Midwife

## 2014-09-13 ENCOUNTER — Encounter: Payer: Self-pay | Admitting: Certified Nurse Midwife

## 2014-09-13 VITALS — BP 110/68 | HR 70 | Resp 16 | Ht 65.5 in | Wt 211.0 lb

## 2014-09-13 DIAGNOSIS — Z0000 Encounter for general adult medical examination without abnormal findings: Secondary | ICD-10-CM | POA: Diagnosis not present

## 2014-09-13 DIAGNOSIS — Z01419 Encounter for gynecological examination (general) (routine) without abnormal findings: Secondary | ICD-10-CM | POA: Diagnosis not present

## 2014-09-13 LAB — LIPID PANEL
CHOL/HDL RATIO: 4.7 ratio
Cholesterol: 249 mg/dL — ABNORMAL HIGH (ref 0–200)
HDL: 53 mg/dL (ref >=46)
LDL CALC: 176 mg/dL — AB (ref 0–99)
TRIGLYCERIDES: 100 mg/dL (ref <150)
VLDL: 20 mg/dL (ref 0–40)

## 2014-09-13 LAB — COMPREHENSIVE METABOLIC PANEL
ALT: 15 U/L (ref 0–35)
AST: 20 U/L (ref 0–37)
Albumin: 4.2 g/dL (ref 3.5–5.2)
Alkaline Phosphatase: 49 U/L (ref 39–117)
BUN: 10 mg/dL (ref 6–23)
CALCIUM: 8.8 mg/dL (ref 8.4–10.5)
CHLORIDE: 102 meq/L (ref 96–112)
CO2: 26 meq/L (ref 19–32)
CREATININE: 0.8 mg/dL (ref 0.50–1.10)
Glucose, Bld: 79 mg/dL (ref 70–99)
Potassium: 4 mEq/L (ref 3.5–5.3)
Sodium: 137 mEq/L (ref 135–145)
TOTAL PROTEIN: 7 g/dL (ref 6.0–8.3)
Total Bilirubin: 0.3 mg/dL (ref 0.2–1.2)

## 2014-09-13 NOTE — Progress Notes (Signed)
39 y.o. [id number] Married  African American Fe here for annual exam. Periods normal for the past 6 months until this month with 2 cycles in one month. Bleeding was normal, but slightly heavy. Patient feels her thyroid maybe not stable again.  Patient will continue to keep menses calendar. Sees Endocrine for management of thyroid and is planning to establish with new PCP. Request screening labs today. No HSV outbreaks. No other health issues today.  Patient's last menstrual period was 09/06/2014.          Sexually active: Yes.    The current method of family planning is tubal ligation.    Exercising: No.  exercise Smoker:  no  Health Maintenance: Pap: 09-11-13 neg HPV HR neg MMG:  2007, 03-22-14 u/s left breast neg Colonoscopy:  none BMD:   none TDaP:  2009 Labs: Hgb-12.4 Self breast exam: done monthly    reports that she has never smoked. She does not have any smokeless tobacco history on file. She reports that she drinks alcohol. She reports that she does not use illicit drugs.  Past Medical History  Diagnosis Date  . Complication of anesthesia 2009    failed epidural, severe pain from second attempt  . HSV-2 infection   . Hypothyroid     graves    Past Surgical History  Procedure Laterality Date  . Cesarean section    . Uterine suspension  2007  . Dilation and curettage of uterus  1995    TAB  . Superficial lymph node biopsy / excision    . Laparoscopic tubal ligation  03/21/2012    Procedure: LAPAROSCOPIC TUBAL LIGATION;  Surgeon: Marvene Staff, MD;  Location: [facility];  Service: Gynecology;  Laterality: Bilateral;  with cautery  . Iud removal  03/21/2012    Procedure: INTRAUTERINE DEVICE (IUD) REMOVAL;  Surgeon: Marvene Staff, MD;  Location: [facility];  Service: Gynecology;  Laterality: N/A;  . Hysteroscopy  03/21/2012    Procedure: HYSTEROSCOPY;  Surgeon: Marvene Staff, MD;  Location: [facility];  Service: Gynecology;;  . Tubal ligation      Current Outpatient  Prescriptions  Medication Sig Dispense Refill  . ibuprofen (ADVIL,MOTRIN) 600 MG tablet Take 1 tablet (600 mg total) by mouth every 6 (six) hours as needed. 30 tablet 2  . SYNTHROID 175 MCG tablet TAKE 1 TABLET (175 MCG TOTAL) BY MOUTH DAILY BEFORE BREAKFAST. 90 tablet 0  . tretinoin (RETIN-A) 0.05 % cream as needed.  3  . Clindamycin-Benzoyl Per-Moist (NEUAC) 1.2-5 % KIT at bedtime.  2   No current facility-administered medications for this visit.    Family History  Problem Relation Age of Onset  . Hypertension Mother   . Hypertension Father   . Breast cancer Paternal Aunt   . Thyroid disease Paternal Aunt   . Breast cancer Maternal Aunt   . Thyroid disease Brother   . Stroke Maternal Grandmother   . Stroke Paternal Grandmother   . Thyroid disease Paternal Grandmother   . Thyroid disease Paternal Aunt     ROS:  Pertinent items are noted in HPI.  Otherwise, a comprehensive ROS was negative.  Exam:   BP 110/68 mmHg  Pulse 70  Resp 16  Ht 5' 5.5" (1.664 m)  Wt 211 lb (95.709 kg)  BMI 34.57 kg/m2  LMP 09/06/2014 Height: 5' 5.5" (166.4 cm) Ht Readings from Last 3 Encounters:  09/13/14 5' 5.5" (1.664 m)  05/03/14 5' 5.25" (1.657 m)  03/15/14 5' 5.25" (1.657 m)  General appearance: alert, cooperative and appears stated age Head: Normocephalic, without obvious abnormality, atraumatic Neck: no adenopathy, supple, symmetrical, trachea midline and thyroid normal to inspection and palpation Lungs: clear to auscultation bilaterally Breasts: normal appearance, no masses or tenderness, No nipple retraction or dimpling, No nipple discharge or bleeding, No axillary or supraclavicular adenopathy Heart: regular rate and rhythm Abdomen: soft, non-tender; no masses,  no organomegaly Extremities: extremities normal, atraumatic, no cyanosis or edema Skin: Skin color, texture, turgor normal. No rashes or lesions Lymph nodes: Cervical, supraclavicular, and axillary nodes normal. No  abnormal inguinal nodes palpated Neurologic: Grossly normal   Pelvic: External genitalia:  no lesions              Urethra:  normal appearing urethra with no masses, tenderness or lesions              Bartholin's and Skene's: normal                 Vagina: normal appearing vagina with normal color and discharge, no lesions              Cervix: normal appearance, no lesions              Pap taken: No. Bimanual Exam:  Uterus:  normal size, contour, position, consistency, mobility, non-tender              Adnexa: normal adnexa and no mass, fullness, tenderness               Rectovaginal: Confirms               Anus:  normal sphincter tone, no lesions    A:  Well Woman with normal exam  ?Cycle change with short cycle this month  Hypothyroid stable medication with Endocrine  History of HSV 2 no outbreaks  Screening labs  P:   Reviewed health and wellness pertinent to exam  Patient will keep menses calendar and advise if continues to occur  Labs: TSH, Lipid panel, CMP, Vitamin D  Pap smear not taken today   counseled on breast self exam, mammography screening, adequate intake of calcium and vitamin D, diet and exercise  return annually or prn  An After Visit Summary was printed and given to the patient.

## 2014-09-13 NOTE — Patient Instructions (Signed)

## 2014-09-14 LAB — TSH: TSH: 40.089 u[IU]/mL — ABNORMAL HIGH (ref 0.350–4.500)

## 2014-09-14 LAB — VITAMIN D 25 HYDROXY (VIT D DEFICIENCY, FRACTURES): Vit D, 25-Hydroxy: 12 ng/mL — ABNORMAL LOW (ref 30–100)

## 2014-09-17 LAB — HEMOGLOBIN, FINGERSTICK: Hemoglobin, fingerstick: 12.4 g/dL (ref 12.0–16.0)

## 2014-09-18 ENCOUNTER — Other Ambulatory Visit: Payer: Self-pay

## 2014-09-18 MED ORDER — VITAMIN D (ERGOCALCIFEROL) 1.25 MG (50000 UNIT) PO CAPS: 50000.0000 [IU] | ORAL_CAPSULE | ORAL | Status: DC

## 2014-09-18 NOTE — Telephone Encounter (Signed)
rx vit d sent to pharmacy. See labs

## 2014-09-19 NOTE — Progress Notes (Signed)
Reviewed personally.  M. Suzanne Syerra Abdelrahman, MD.  

## 2014-09-21 ENCOUNTER — Other Ambulatory Visit: Payer: Self-pay | Admitting: *Deleted

## 2014-09-21 ENCOUNTER — Telehealth: Payer: Self-pay | Admitting: Internal Medicine

## 2014-09-21 NOTE — Telephone Encounter (Signed)
Pt called regarding the high results of th lab work

## 2014-09-21 NOTE — Telephone Encounter (Signed)
Opened encounter in error  

## 2014-09-21 NOTE — Telephone Encounter (Signed)
Returned pt's call. Pt's OB did labs on pt. Her TSH is very high. Pt states that she has taken her rx consistently in the AM with water and no other meds/vitamins near the time. Pt wants Dr Cruzita Lederer to review labs (are in [place]) and advise her is she needs an appt or if just a med change. Please advise.

## 2014-09-24 NOTE — Telephone Encounter (Signed)
I did not see her since 02/2013. I need to see her. Stay on the same dose for now. Take the thyroid hormone every day, with water, >30 minutes before breakfast, separated by >4 hours from acid reflux medications, calcium, iron, multivitamins.

## 2014-09-25 ENCOUNTER — Telehealth: Payer: Self-pay | Admitting: *Deleted

## 2014-09-25 NOTE — Telephone Encounter (Signed)
Called pt and advised her per Dr Arman Filter message. Pt understood. Pt was upset because she is having sx and she stated that she has been taking her Synthroid correctly. Advised pt that she needed to make an appt due to the fact that she has not been seen since 02/2013. Pt stated she has an appt on 4/29. Upon looking in the pt's chart, I advised pt that she did not. Pt was upset. Pt said the lady in the front office was suppose to re-schedule her appt for 4/29 from 5/2. Pt was upset, but did schedule the next available appt with Dr Cruzita Lederer for 5/12. Pt stated that she wanted to file a formal complaint because she had the understanding that her appt was on 4/29 and cancelled without her being told. Advised pt that I would let the office supervisor know. Be advised.

## 2014-09-25 NOTE — Telephone Encounter (Signed)
As mentioned before, need to take thyroid med correctly and I will recheck her tests at next visit. She was well controlled on sam dose in the past, so I will not increase her dose for now.

## 2014-09-25 NOTE — Telephone Encounter (Signed)
I apologize that I forgot to add the sx to the last encounter. Pt states that she is fatigued, but not resting or sleeping well. She is having palpitations, her period is irregular and she is hot, then extremely cold (back and forth). Please advise.

## 2014-10-01 ENCOUNTER — Other Ambulatory Visit: Payer: Self-pay | Admitting: Internal Medicine

## 2014-10-18 ENCOUNTER — Encounter: Payer: Self-pay | Admitting: Internal Medicine

## 2014-10-18 ENCOUNTER — Ambulatory Visit (INDEPENDENT_AMBULATORY_CARE_PROVIDER_SITE_OTHER): Payer: 59 | Admitting: Internal Medicine

## 2014-10-18 VITALS — BP 112/62 | HR 100 | Temp 98.6°F | Resp 14 | Wt 206.8 lb

## 2014-10-18 DIAGNOSIS — E039 Hypothyroidism, unspecified: Secondary | ICD-10-CM

## 2014-10-18 NOTE — Progress Notes (Signed)
Subjective:     Patient ID: Jordan [name], female   DOB: [birth year], 39 y.o.   MRN: [id number]  HPI Jordan [name] is a 39 y.o. woman, returning for followup for hypothyroidism post RAI ablation for Graves' disease in 1997. Last visit 1 year and 7 mo ago.   Reviewed TFTs - latest TSH was checked at the appointment with OB/GYN. Lab Results  Component Value Date   TSH 40.089* 09/13/2014   TSH 0.58 03/03/2013   TSH 9.98* 11/25/2012   TSH 1.49 10/27/2007   TSH 21.82* 10/11/2007   TSH 6.62* 09/02/2007   FREET4 1.01 03/03/2013   FREET4 0.63 11/25/2012   She is on brand name Synthroid DAW 175 mcg: - in am - with water - eats b'fast 1.5 hr later - no PPI - no calcium - no Fe - + MVI chewable at night  She remembers being off LT4 x3 consecutive days before a refill right before the previous test.  She c/o:  - + weight gain - over past year: 20 lbs; but lost 4-5 pounds in the last month - + fatigue, improved in the last month - no palpitations - no anxiety - + Feeling hot and cold - no hyperdefecation, + long-standing constipation  No h/o thyroid nodules. No dysphagia, hoarseness, SOB with lying down.   I reviewed pt's medications, allergies, PMH, social hx, family hx, and changes were documented in the history of present illness. Otherwise, unchanged from my initial visit note. She started vitamin D supplementation.  Review of Systems Constitutional: + weight gain, + fatigue, + both:  subjective hyperthermia/hypothermia, + poor sleep Eyes: no blurry vision, no xerophthalmia ENT: no sore throat, no nodules palpated in throat, no dysphagia/odynophagia, no hoarseness, + decreased hearing Cardiovascular: no CP/SOB/palpitations/leg swelling Respiratory: no cough/SOB Gastrointestinal: no N/V/D/C Musculoskeletal: no muscle/+ joint aches Skin: no rashes + menstrual cycle irregularity + Low libido  Objective:   Physical Exam BP 112/62 mmHg  Pulse 100  Temp(Src) 98.6 F (37 C)  (Oral)  Resp 14  Wt 206 lb 12.8 oz (93.804 kg)  SpO2 98% Body mass index is 33.88 kg/(m^2). Wt Readings from Last 3 Encounters:  10/18/14 206 lb 12.8 oz (93.804 kg)  09/13/14 211 lb (95.709 kg)  05/03/14 208 lb (94.348 kg)   Constitutional: obese, in NAD Eyes: PERRLA, EOMI, no exophthalmos ENT: moist mucous membranes, no thyromegaly, no cervical lymphadenopathy Cardiovascular: RRR, + SEM 2/6, no RG Respiratory: CTA B Gastrointestinal: abdomen soft, NT, ND, BS+ Musculoskeletal: no deformities, strength intact in all 4 Skin: moist, warm, no rashes Neurological: no tremor with outstretched hands, DTR decreased in all 4  Assessment:     1. Iatrogenic hypothyroidism - post ablation for Graves ds 1997 - on brand-name Synthroid    Plan:     Pt with long-standing post ablative hypothyroidism, with variable control in the past, returning after a long absence. Her latest TSH obtained at last visit was normal, at 0.58, on 175 g of Synthroid. She was then lost for follow-up and had a TSH checked by her OB/GYN doctor, after she complained of weight gain and fatigue. This returned at 9. She mentions that she is taking the levothyroxine correctly, but might have skipped doses. I believe that this is probably the reason why her TSH has increased, since it was normal in the past on the same dose. - We discussed correct intake of thyroid hormone, EVERY DAY, separated by >30 min from breakfast, and >4h from PPI, calcium, iron, MVI. -  we will check TFTs today. If improving, will continue the same dose and recheck in 5 weeks. If the tests are very high, she may need an increase in dose, although this is unlikely - Will schedule a new appt in 4 months   Office Visit on 10/18/2014  Component Date Value Ref Range Status  . TSH 10/18/2014 0.36  0.35 - 4.50 uIU/mL Final  . Free T4 10/18/2014 0.91  0.60 - 1.60 ng/dL Final   TFTs normalized after she started to take the med consistently (dose was not  changed). Continue 175 mcg of Synthroid.

## 2014-10-18 NOTE — Patient Instructions (Signed)
Please stop at the lab.  Please come back for a follow-up appointment in 4 months.   

## 2014-10-19 ENCOUNTER — Ambulatory Visit: Payer: Self-pay | Admitting: Internal Medicine

## 2014-10-19 LAB — T4, FREE: FREE T4: 0.91 ng/dL (ref 0.60–1.60)

## 2014-10-19 LAB — TSH: TSH: 0.36 u[IU]/mL (ref 0.35–4.50)

## 2014-10-22 ENCOUNTER — Ambulatory Visit: Payer: Self-pay | Admitting: Internal Medicine

## 2014-10-30 NOTE — Progress Notes (Signed)
Reviewed note from Endocrine on post ablation for Graves disease and plan for follow up.

## 2014-11-01 ENCOUNTER — Ambulatory Visit: Payer: 59 | Admitting: Internal Medicine

## 2015-01-10 ENCOUNTER — Telehealth: Payer: Self-pay | Admitting: Internal Medicine

## 2015-01-10 ENCOUNTER — Other Ambulatory Visit: Payer: Self-pay | Admitting: *Deleted

## 2015-01-10 MED ORDER — SYNTHROID 175 MCG PO TABS: 175.0000 ug | ORAL_TABLET | Freq: Every day | ORAL | Status: DC

## 2015-01-10 NOTE — Telephone Encounter (Signed)
Rx refill sent in. Called pt and advised her.

## 2015-01-10 NOTE — Telephone Encounter (Signed)
Patient called, she needs her Synthroid called into her pharmacy. Please let her know when this is done.  Thanks

## 2015-02-18 ENCOUNTER — Encounter: Payer: Self-pay | Admitting: Internal Medicine

## 2015-02-18 ENCOUNTER — Other Ambulatory Visit: Payer: Self-pay | Admitting: *Deleted

## 2015-02-18 ENCOUNTER — Ambulatory Visit (INDEPENDENT_AMBULATORY_CARE_PROVIDER_SITE_OTHER): Payer: 59 | Admitting: Internal Medicine

## 2015-02-18 VITALS — BP 114/66 | HR 87 | Temp 98.9°F | Resp 12 | Wt 207.0 lb

## 2015-02-18 DIAGNOSIS — E039 Hypothyroidism, unspecified: Secondary | ICD-10-CM

## 2015-02-18 MED ORDER — SYNTHROID 175 MCG PO TABS: 175.0000 ug | ORAL_TABLET | Freq: Every day | ORAL | Status: DC

## 2015-02-18 NOTE — Progress Notes (Addendum)
Subjective:     Patient ID: Jordan [name], female   DOB: 10/06/1975, 39 y.o.   MRN: [id number]  HPI [name] is a 39 y.o. woman, returning for followup for hypothyroidism post RAI ablation for Graves' disease in 1997. Last visit 4 mo ago.  Reviewed TFTs - TFTs normalized after she started to take the med consistently (dose was not changed).  Lab Results  Component Value Date   TSH 0.36 10/18/2014   TSH 40.089* 09/13/2014   TSH 0.58 03/03/2013   TSH 9.98* 11/25/2012   TSH 1.49 10/27/2007   TSH 21.82* 10/11/2007   FREET4 0.91 10/18/2014   FREET4 1.01 03/03/2013   FREET4 0.63 11/25/2012   She is on brand name Synthroid DAW 175 mcg. She missed 2 days during the weekend.  - in am - with water - eats b'fast 1.5 hr later - no PPI - no calcium - no Fe - + MVI at night  She c/o:  - no weight gain - + fatigue - no palpitations - no anxiety - no heat or cold intolerance - no hyperdefecation, + long-standing constipation  No h/o thyroid nodules. No dysphagia, hoarseness, SOB with lying down.   I reviewed pt's medications, allergies, PMH, social hx, family hx, and changes were documented in the history of present illness. Otherwise, unchanged from my initial visit note. She started vitamin D supplementation.  Review of Systems Constitutional: see HPI Eyes: no blurry vision, no xerophthalmia ENT: no sore throat, no nodules palpated in throat, no dysphagia/odynophagia, no hoarseness Cardiovascular: no CP/SOB/palpitations/leg swelling Respiratory: no cough/SOB Gastrointestinal: no N/V/D/C Musculoskeletal: no muscle/joint aches Skin: no rashes  Objective:   Physical Exam BP 114/66 mmHg  Pulse 87  Temp(Src) 98.9 F (37.2 C) (Oral)  Resp 12  Wt 207 lb (93.895 kg)  SpO2 97% There is no weight on file to calculate BMI. Wt Readings from Last 3 Encounters:  02/18/15 207 lb (93.895 kg)  10/18/14 206 lb 12.8 oz (93.804 kg)  09/13/14 211 lb (95.709 kg)   Constitutional:  obese, in NAD Eyes: PERRLA, EOMI, no exophthalmos ENT: moist mucous membranes, no thyromegaly, no cervical lymphadenopathy Cardiovascular: RRR, + SEM 2/6, no RG Respiratory: CTA B Gastrointestinal: abdomen soft, NT, ND, BS+ Musculoskeletal: no deformities, strength intact in all 4 Skin: moist, warm, no rashes Neurological: no tremor with outstretched hands, DTR decreased in all 4  Assessment:     1. Iatrogenic hypothyroidism - post ablation for Graves ds 1997 - on brand-name Synthroid    Plan:     Pt with long-standing post ablative hypothyroidism, with variable control in the past due to noncompliance (she again missed 2 doses of LT4 over the weekend!). She is on 175 g of Synthroid >> she feels that this is a good dose for her. - We discussed correct intake of thyroid hormone, EVERY DAY, separated by >30 min from breakfast, and >4h from PPI, calcium, iron, MVI. She takes it correctly but occasionally runs out. - we will check TFTs today. We may need to repeat them if abnormal since missed doses recently... - Will schedule a new appt in 6 months  - Needs 90 days refill - Synthroid DAW.  Office Visit on 02/18/2015  Component Date Value Ref Range Status  . TSH 02/18/2015 0.85  0.35 - 4.50 uIU/mL Final  . Free T4 02/18/2015 0.90  0.60 - 1.60 ng/dL Final   Normal thyroid tests. Will refill her Synthroid.

## 2015-02-18 NOTE — Patient Instructions (Signed)
Please stop at the lab.  Please try not to miss Synthroid doses.  Please come back for a follow-up appointment in 6 months

## 2015-02-19 LAB — T4, FREE: FREE T4: 0.9 ng/dL (ref 0.60–1.60)

## 2015-02-19 LAB — TSH: TSH: 0.85 u[IU]/mL (ref 0.35–4.50)

## 2015-02-19 MED ORDER — SYNTHROID 175 MCG PO TABS: 175.0000 ug | ORAL_TABLET | Freq: Every day | ORAL | Status: DC

## 2015-02-19 NOTE — Addendum Note (Signed)
Addended by: Philemon Kingdom on: 02/19/2015 12:13 PM   Modules accepted: Orders

## 2015-04-28 ENCOUNTER — Emergency Department (INDEPENDENT_AMBULATORY_CARE_PROVIDER_SITE_OTHER)
Admission: EM | Admit: 2015-04-28 | Discharge: 2015-04-28 | Disposition: A | Discharge status: Home / Self Care | Payer: 59 | Source: Home / Self Care | Attending: Emergency Medicine | Admitting: Emergency Medicine

## 2015-04-28 ENCOUNTER — Encounter (HOSPITAL_COMMUNITY): Payer: Self-pay | Admitting: Emergency Medicine

## 2015-04-28 DIAGNOSIS — J069 Acute upper respiratory infection, unspecified: Secondary | ICD-10-CM | POA: Diagnosis not present

## 2015-04-28 MED ORDER — IPRATROPIUM BROMIDE 0.06 % NA SOLN: 2.0000 | Freq: Four times a day (QID) | NASAL | Status: DC

## 2015-04-28 NOTE — ED Notes (Signed)
Onset of symptoms on Tuesday.  Ears have pressure, sore throat , coughing, runny nose, congestion in throat, coughing up phlegm, chills, and feeling very tired.

## 2015-04-28 NOTE — ED Provider Notes (Signed)
CSN: [id number]     Arrival date & time 04/28/15  1416 History   First MD Initiated Contact with Patient 04/28/15 1550     Chief Complaint  Patient presents with  . URI   (Consider location/radiation/quality/duration/timing/severity/associated sxs/prior Treatment) HPI Comments: 39 year old female complaining of a cough, low-grade fever, erythematous, runny nose, nasal congestion, "throat congestion", PND and feeling tired. She had a sore throat several days ago but not now. She is taking over-the-counter Mucinex and Alka-Seltzer cold plus. Symptoms started approximately 5 days ago.   Past Medical History  Diagnosis Date  . Complication of anesthesia 2009    failed epidural, severe pain from second attempt  . HSV-2 infection   . Hypothyroid     graves   Past Surgical History  Procedure Laterality Date  . Cesarean section    . Uterine suspension  2007  . Dilation and curettage of uterus  1995    TAB  . Superficial lymph node biopsy / excision    . Laparoscopic tubal ligation  03/21/2012    Procedure: LAPAROSCOPIC TUBAL LIGATION;  Surgeon: Marvene Staff, MD;  Location: [facility];  Service: Gynecology;  Laterality: Bilateral;  with cautery  . Iud removal  03/21/2012    Procedure: INTRAUTERINE DEVICE (IUD) REMOVAL;  Surgeon: Marvene Staff, MD;  Location: [facility];  Service: Gynecology;  Laterality: N/A;  . Hysteroscopy  03/21/2012    Procedure: HYSTEROSCOPY;  Surgeon: Marvene Staff, MD;  Location: [facility];  Service: Gynecology;;  . Tubal ligation     Family History  Problem Relation Age of Onset  . Hypertension Mother   . Hypertension Father   . Breast cancer Paternal Aunt   . Thyroid disease Paternal Aunt   . Breast cancer Maternal Aunt   . Thyroid disease Brother   . Stroke Maternal Grandmother   . Stroke Paternal Grandmother   . Thyroid disease Paternal Grandmother   . Thyroid disease Paternal Aunt    Social History  Substance Use Topics  . Smoking status:  Never Smoker   . Smokeless tobacco: None  . Alcohol Use: 0.0 oz/week    0 Standard drinks or equivalent per week     Comment: 1 every other month   OB History    Gravida Para Term Preterm AB TAB SAB Ectopic Multiple Living   3 2 2  1 1    2      Review of Systems  Constitutional: Positive for fever and activity change. Negative for chills, appetite change and fatigue.  HENT: Positive for congestion, ear pain, postnasal drip and rhinorrhea. Negative for facial swelling and sore throat.   Eyes: Negative.   Respiratory: Positive for cough. Negative for shortness of breath.   Cardiovascular: Negative.   Musculoskeletal: Negative for neck pain and neck stiffness.  Skin: Negative for pallor and rash.  Neurological: Negative.     Allergies  Review of patient's allergies indicates no known allergies.  Home Medications   Prior to Admission medications   Medication Sig Start Date End Date Taking? Authorizing Provider  fexofenadine (ALLEGRA) 60 MG tablet Take 60 mg by mouth 2 (two) times daily.   Yes Historical Provider, MD  Clindamycin-Benzoyl Per-Moist (NEUAC) 1.2-5 % KIT at bedtime. 01/30/14   Historical Provider, MD  ibuprofen (ADVIL,MOTRIN) 600 MG tablet Take 1 tablet (600 mg total) by mouth every 6 (six) hours as needed. 05/03/14   Regina Eck, CNM  ipratropium (ATROVENT) 0.06 % nasal spray Place 2 sprays into both nostrils  4 (four) times daily. 04/28/15   Janne Napoleon, NP  SYNTHROID 175 MCG tablet Take 1 tablet (175 mcg total) by mouth daily before breakfast. 02/19/15   Philemon Kingdom, MD  tretinoin (RETIN-A) 0.05 % cream as needed. 02/08/14   Historical Provider, MD  Vitamin D, Ergocalciferol, (DRISDOL) 50000 UNITS CAPS capsule Take 1 capsule (50,000 Units total) by mouth every 7 (seven) days. 09/18/14   Regina Eck, CNM   Meds Ordered and Administered this Visit  Medications - No data to display  BP 149/83 mmHg  Pulse 82  Temp(Src) 99.7 F (37.6 C) (Oral)  Resp 16   SpO2 100%  LMP 04/24/2015 No data found.   Physical Exam  Constitutional: She is oriented to person, place, and time. She appears well-developed and well-nourished. No distress.  HENT:  Bilateral TMs with retraction. No erythema or bulging. Oropharynx with minor streaky erythema. No exudates. Airway widely patent.  Eyes: Conjunctivae and EOM are normal.  Neck: Normal range of motion. Neck supple.  Cardiovascular: Normal rate, regular rhythm and normal heart sounds.   Pulmonary/Chest: Effort normal and breath sounds normal. No respiratory distress. She has no wheezes. She has no rales.  Musculoskeletal: Normal range of motion. She exhibits no edema.  Lymphadenopathy:    She has no cervical adenopathy.  Neurological: She is alert and oriented to person, place, and time.  Skin: Skin is warm and dry. No rash noted.  Psychiatric: She has a normal mood and affect.  Nursing note and vitals reviewed.   ED Course  Procedures (including critical care time)  Labs Review Labs Reviewed - No data to display  Imaging Review No results found.   Visual Acuity Review  Right Eye Distance:   Left Eye Distance:   Bilateral Distance:    Right Eye Near:   Left Eye Near:    Bilateral Near:         MDM   1. URI (upper respiratory infection)     Upper Respiratory Infection, Adult Atrovent nasal spray as directed for nasal congestion and runny nose May continue the Allegra. Other nonsedating anti-histamines include Claritin and Zyrtec. For more potent anti- histamine activity may use Chlor-Trimeton (chlorpheniramine) 2-4 mg may improve drainage. May also cause drowsiness. Sudafed PE 10 mg as a decongestion. Ibuprofen 6 mg every 6 hours as needed for discomfort Drink plenty fluids and stay well-hydrated Rest, chicken soup, vitamins    Janne Napoleon, NP 04/28/15 1634

## 2015-04-28 NOTE — Discharge Instructions (Signed)
Upper Respiratory Infection, Adult Atrovent nasal spray as directed for nasal congestion and runny nose May continue the Allegra. Other nonsedating anti-histamines include Claritin and Zyrtec. For more potent anti- histamine activity may use Chlor-Trimeton (chlorpheniramine) 2-4 mg may improve drainage. May also cause drowsiness. Sudafed PE 10 mg as a decongestion. Ibuprofen 6 mg every 6 hours as needed for discomfort Drink plenty fluids and stay well-hydrated Rest, chicken soup, vitamins Most upper respiratory infections (URIs) are a viral infection of the air passages leading to the lungs. A URI affects the nose, throat, and upper air passages. The most common type of URI is nasopharyngitis and is typically referred to as "the common cold." URIs run their course and usually go away on their own. Most of the time, a URI does not require medical attention, but sometimes a bacterial infection in the upper airways can follow a viral infection. This is called a secondary infection. Sinus and middle ear infections are common types of secondary upper respiratory infections. Bacterial pneumonia can also complicate a URI. A URI can worsen asthma and chronic obstructive pulmonary disease (COPD). Sometimes, these complications can require emergency medical care and may be life threatening.  CAUSES Almost all URIs are caused by viruses. A virus is a type of germ and can spread from one person to another.  RISKS FACTORS You may be at risk for a URI if:   You smoke.   You have chronic heart or lung disease.  You have a weakened defense (immune) system.   You are very young or very old.   You have nasal allergies or asthma.  You work in crowded or poorly ventilated areas.  You work in health care facilities or schools. SIGNS AND SYMPTOMS  Symptoms typically develop 2-3 days after you come in contact with a cold virus. Most viral URIs last 7-10 days. However, viral URIs from the influenza virus  (flu virus) can last 14-18 days and are typically more severe. Symptoms may include:   Runny or stuffy (congested) nose.   Sneezing.   Cough.   Sore throat.   Headache.   Fatigue.   Fever.   Loss of appetite.   Pain in your forehead, behind your eyes, and over your cheekbones (sinus pain).  Muscle aches.  DIAGNOSIS  Your health care provider may diagnose a URI by:  Physical exam.  Tests to check that your symptoms are not due to another condition such as:  Strep throat.  Sinusitis.  Pneumonia.  Asthma. TREATMENT  A URI goes away on its own with time. It cannot be cured with medicines, but medicines may be prescribed or recommended to relieve symptoms. Medicines may help:  Reduce your fever.  Reduce your cough.  Relieve nasal congestion. HOME CARE INSTRUCTIONS   Take medicines only as directed by your health care provider.   Gargle warm saltwater or take cough drops to comfort your throat as directed by your health care provider.  Use a warm mist humidifier or inhale steam from a shower to increase air moisture. This may make it easier to breathe.  Drink enough fluid to keep your urine clear or pale yellow.   Eat soups and other clear broths and maintain good nutrition.   Rest as needed.   Return to work when your temperature has returned to normal or as your health care provider advises. You may need to stay home longer to avoid infecting others. You can also use a face mask and careful hand washing to prevent  spread of the virus.  Increase the usage of your inhaler if you have asthma.   Do not use any tobacco products, including cigarettes, chewing tobacco, or electronic cigarettes. If you need help quitting, ask your health care provider. PREVENTION  The best way to protect yourself from getting a cold is to practice good hygiene.   Avoid oral or hand contact with people with cold symptoms.   Wash your hands often if contact occurs.   There is no clear evidence that vitamin C, vitamin E, echinacea, or exercise reduces the chance of developing a cold. However, it is always recommended to get plenty of rest, exercise, and practice good nutrition.  SEEK MEDICAL CARE IF:   You are getting worse rather than better.   Your symptoms are not controlled by medicine.   You have chills.  You have worsening shortness of breath.  You have brown or red mucus.  You have yellow or brown nasal discharge.  You have pain in your face, especially when you bend forward.  You have a fever.  You have swollen neck glands.  You have pain while swallowing.  You have white areas in the back of your throat. SEEK IMMEDIATE MEDICAL CARE IF:   You have severe or persistent:  Headache.  Ear pain.  Sinus pain.  Chest pain.  You have chronic lung disease and any of the following:  Wheezing.  Prolonged cough.  Coughing up blood.  A change in your usual mucus.  You have a stiff neck.  You have changes in your:  Vision.  Hearing.  Thinking.  Mood. MAKE SURE YOU:   Understand these instructions.  Will watch your condition.  Will get help right away if you are not doing well or get worse.   This information is not intended to replace advice given to you by your health care provider. Make sure you discuss any questions you have with your health care provider.   Document Released: 12/02/2000 Document Revised: 10/23/2014 Document Reviewed: 09/13/2013 Elsevier Interactive Patient Education [employer].

## 2015-07-01 ENCOUNTER — Emergency Department (HOSPITAL_COMMUNITY)
Admission: EM | Admit: 2015-07-01 | Discharge: 2015-07-01 | Disposition: A | Discharge status: Home / Self Care | Payer: 59 | Source: Home / Self Care | Attending: Family Medicine | Admitting: Family Medicine

## 2015-07-01 ENCOUNTER — Encounter (HOSPITAL_COMMUNITY): Payer: Self-pay | Admitting: *Deleted

## 2015-07-01 DIAGNOSIS — J0110 Acute frontal sinusitis, unspecified: Secondary | ICD-10-CM

## 2015-07-01 MED ORDER — AMOXICILLIN-POT CLAVULANATE 875-125 MG PO TABS: 1.0000 | ORAL_TABLET | Freq: Two times a day (BID) | ORAL | Status: DC

## 2015-07-01 NOTE — ED Notes (Signed)
Pt had a cold about 1 1/2 months ago   It never totally resolved.   Today she c/o thick colored sinus drainage and productive cough.  She has had a low grade fever at home with chills she states

## 2015-07-01 NOTE — ED Provider Notes (Signed)
CSN: [id number]     Arrival date & time 07/01/15  1314 History   First MD Initiated Contact with Patient 07/01/15 1511     Chief Complaint  Patient presents with  . URI   (Consider location/radiation/quality/duration/timing/severity/associated sxs/prior Treatment) HPI URI symptoms for about 1 week, not getting better with OTC meds.  Nasal drainage green and brown, no foul odor but nasty taste Past Medical History  Diagnosis Date  . Complication of anesthesia 2009    failed epidural, severe pain from second attempt  . HSV-2 infection   . Hypothyroid     graves   Past Surgical History  Procedure Laterality Date  . Cesarean section    . Uterine suspension  2007  . Dilation and curettage of uterus  1995    TAB  . Superficial lymph node biopsy / excision    . Laparoscopic tubal ligation  03/21/2012    Procedure: LAPAROSCOPIC TUBAL LIGATION;  Surgeon: Marvene Staff, MD;  Location: [facility];  Service: Gynecology;  Laterality: Bilateral;  with cautery  . Iud removal  03/21/2012    Procedure: INTRAUTERINE DEVICE (IUD) REMOVAL;  Surgeon: Marvene Staff, MD;  Location: [facility];  Service: Gynecology;  Laterality: N/A;  . Hysteroscopy  03/21/2012    Procedure: HYSTEROSCOPY;  Surgeon: Marvene Staff, MD;  Location: [facility];  Service: Gynecology;;  . Tubal ligation     Family History  Problem Relation Age of Onset  . Hypertension Mother   . Hypertension Father   . Breast cancer Paternal Aunt   . Thyroid disease Paternal Aunt   . Breast cancer Maternal Aunt   . Thyroid disease Brother   . Stroke Maternal Grandmother   . Stroke Paternal Grandmother   . Thyroid disease Paternal Grandmother   . Thyroid disease Paternal Aunt    Social History  Substance Use Topics  . Smoking status: Never Smoker   . Smokeless tobacco: None  . Alcohol Use: 0.0 oz/week    0 Standard drinks or equivalent per week     Comment: 1 every other month   OB History    Gravida Para Term Preterm AB  TAB SAB Ectopic Multiple Living   _0 Review of Systems ROS +'veURI symptoms  Denies: HEADACHE, NAUSEA, ABDOMINAL PAIN, CHEST PAIN, CONGESTION, DYSURIA, SHORTNESS OF BREATH  Allergies  Review of patient's allergies indicates no known allergies.  Home Medications   Prior to Admission medications   Medication Sig Start Date End Date Taking? Authorizing Provider  fexofenadine (ALLEGRA) 60 MG tablet Take 60 mg by mouth 2 (two) times daily.   Yes Historical Provider, MD  ibuprofen (ADVIL,MOTRIN) 600 MG tablet Take 1 tablet (600 mg total) by mouth every 6 (six) hours as needed. 05/03/14  Yes Regina Eck, CNM  ipratropium (ATROVENT) 0.06 % nasal spray Place 2 sprays into both nostrils 4 (four) times daily. 04/28/15  Yes Janne Napoleon, NP  SYNTHROID 175 MCG tablet Take 1 tablet (175 mcg total) by mouth daily before breakfast. 02/19/15  Yes Philemon Kingdom, MD  tretinoin (RETIN-A) 0.05 % cream as needed. 02/08/14  Yes Historical Provider, MD  Clindamycin-Benzoyl Per-Moist (NEUAC) 1.2-5 % KIT at bedtime. 01/30/14   Historical Provider, MD  Vitamin D, Ergocalciferol, (DRISDOL) 50000 UNITS CAPS capsule Take 1 capsule (50,000 Units total) by mouth every 7 (seven) days. 09/18/14   Regina Eck, CNM   Meds Ordered and Administered this Visit  Medications - No data to display  BP 132/73 mmHg  Pulse 75  Temp(Src) 99 F (37.2 C) (Oral)  Resp 16  SpO2 99%  LMP 06/18/2015 (Exact Date) No data found.   Physical Exam  Constitutional: She is oriented to person, place, and time. She appears well-developed and well-nourished. No distress.  HENT:  Head: Normocephalic.  Right Ear: External ear normal.  Left Ear: External ear normal.  Mouth/Throat: Oropharynx is clear and moist. No oropharyngeal exudate.  Eyes: Conjunctivae are normal.  Neck: Normal range of motion.  Cardiovascular: Normal rate.   Pulmonary/Chest: Effort normal and breath sounds normal. No respiratory distress.  She has no wheezes.  Musculoskeletal: Normal range of motion.  Neurological: She is alert and oriented to person, place, and time.  Skin: Skin is warm and dry.  Psychiatric: She has a normal mood and affect. Her behavior is normal. Judgment and thought content normal.  Nursing note and vitals reviewed.   ED Course  Procedures (including critical care time)  Labs Review Labs Reviewed - No data to display  Imaging Review No results found.   Visual Acuity Review  Right Eye Distance:   Left Eye Distance:   Bilateral Distance:    Right Eye Near:   Left Eye Near:    Bilateral Near:         MDM   1. Acute frontal sinusitis, recurrence not specified     Patient is advised to continue home symptomatic treatment. Prescription for augmentin   sent pharmacy patient has indicated. Patient is advised that if there are new or worsening symptoms or attend the emergency department, or contact primary care provider. Instructions of care provided discharged home in stable condition.  THIS NOTE WAS GENERATED USING A VOICE RECOGNITION SOFTWARE PROGRAM. ALL REASONABLE EFFORTS  WERE MADE TO PROOFREAD THIS DOCUMENT FOR ACCURACY.     Konrad Felix, [place] 07/01/15 [phone]

## 2015-07-01 NOTE — Discharge Instructions (Signed)

## 2015-08-20 ENCOUNTER — Ambulatory Visit (INDEPENDENT_AMBULATORY_CARE_PROVIDER_SITE_OTHER): Payer: 59 | Admitting: Internal Medicine

## 2015-08-20 ENCOUNTER — Encounter: Payer: Self-pay | Admitting: Internal Medicine

## 2015-08-20 VITALS — BP 120/60 | HR 97 | Temp 98.5°F | Resp 12 | Wt 210.0 lb

## 2015-08-20 DIAGNOSIS — E890 Postprocedural hypothyroidism: Secondary | ICD-10-CM | POA: Diagnosis not present

## 2015-08-20 NOTE — Patient Instructions (Addendum)
Please stop at the lab.  Please continue Synthroid 175 mcg daily.  Take the thyroid hormone every day, with water, at least 30 minutes before breakfast, separated by at least 4 hours from: - acid reflux medications - calcium - iron - multivitamins  Please return in 1 year.

## 2015-08-20 NOTE — Progress Notes (Signed)
Subjective:     Patient ID: Jordan Jordan [name], female   DOB: [birth year], 40 y.o.   MRN: [id number]  HPI Jordan [name] is a 40 y.o. woman, returning for followup for hypothyroidism post RAI ablation for Graves' disease in 1997. Last visit 6 mo ago.  Reviewed TFTs - TFTs normalized after she started to take the med consistently (dose was not changed).  Lab Results  Component Value Date   TSH 0.85 02/18/2015   TSH 0.36 10/18/2014   TSH 40.089* 09/13/2014   TSH 0.58 03/03/2013   TSH 9.98* 11/25/2012   TSH 1.49 10/27/2007   FREET4 0.90 02/18/2015   FREET4 0.91 10/18/2014   FREET4 1.01 03/03/2013   FREET4 0.63 11/25/2012   She is on brand name Synthroid DAW 175 mcg, taken: - in am - with water - eats b'fast 1.5 hr later - no PPI - no calcium - no Fe - + MVI at night  She c/o:  - no weight gain - + fatigue - + palpitations - in last 2 months - no anxiety - no heat or cold intolerance - no hyperdefecation, + long-standing constipation  No h/o thyroid nodules. No dysphagia, hoarseness, SOB with lying down.   I reviewed pt's medications, allergies, PMH, social hx, family hx, and changes were documented in the history of present illness. Otherwise, unchanged from my initial visit note. She started vitamin D supplementation.  Review of Systems Constitutional: see HPI Eyes: no blurry vision, no xerophthalmia ENT: no sore throat, no nodules palpated in throat, no dysphagia/odynophagia, no hoarseness Cardiovascular: no CP/SOB/+ palpitations/no leg swelling Respiratory: no cough/SOB Gastrointestinal: no N/V/D/C Musculoskeletal: no muscle/joint aches Skin: no rashes  Objective:   Physical Exam BP 120/60 mmHg  Pulse 97  Temp(Src) 98.5 F (36.9 C) (Oral)  Resp 12  Wt 210 lb (95.255 kg)  SpO2 97% Body mass index is 34.4 kg/(m^2). Wt Readings from Last 3 Encounters:  08/20/15 210 lb (95.255 kg)  02/18/15 207 lb (93.895 kg)  10/18/14 206 lb 12.8 oz (93.804 kg)   Constitutional:  obese, in NAD Eyes: PERRLA, EOMI, no exophthalmos ENT: moist mucous membranes, no thyromegaly, no cervical lymphadenopathy Cardiovascular: RRR, + SEM 1/6, no RG Respiratory: CTA B Gastrointestinal: abdomen soft, NT, ND, BS+ Musculoskeletal: no deformities, strength intact in all 4 Skin: moist, warm, no rashes Neurological: no tremor with outstretched hands, DTR decreased in all 4  Assessment:     1. Iatrogenic hypothyroidism - post ablation for Graves ds 1997 - on brand-name Synthroid    Plan:     Pt with long-standing post ablative hypothyroidism, previously with variable control due to noncompliance with LT4, now with improved TFTs after she started the med as Rx'ed.  - She is on 175 g of Synthroid - she started to feel palpitations in last 2 mo - We discussed correct intake of thyroid hormone, EVERY DAY, separated by >30 min from breakfast, and >4h from PPI, calcium, iron, MVI. She takes it correctly. - we will check TFTs today.  - Will schedule a new appt in 1 year  - Needs 90 days refill - Synthroid DAW.   Office Visit on 08/20/2015  Component Date Value Ref Range Status  . Free T4 08/20/2015 0.94  0.60 - 1.60 ng/dL Final  . TSH 08/20/2015 3.28  0.35 - 4.50 uIU/mL Final   Msg sent: Dear Jordan Jordan [name], Thyroid tests are normal I will refill the current dose of your Synthroid. The palpitations appear are related to the thyroid status.  Sincerely, Philemon Kingdom MD

## 2015-08-21 LAB — T4, FREE: Free T4: 0.94 ng/dL (ref 0.60–1.60)

## 2015-08-21 LAB — TSH: TSH: 3.28 u[IU]/mL (ref 0.35–4.50)

## 2015-08-21 MED ORDER — SYNTHROID 175 MCG PO TABS: 175.0000 ug | ORAL_TABLET | Freq: Every day | ORAL | Status: DC

## 2015-09-18 ENCOUNTER — Ambulatory Visit: Payer: 59 | Admitting: Certified Nurse Midwife

## 2015-09-20 ENCOUNTER — Ambulatory Visit: Payer: 59 | Admitting: Certified Nurse Midwife

## 2015-09-27 ENCOUNTER — Other Ambulatory Visit: Payer: Self-pay

## 2015-09-27 DIAGNOSIS — Z1231 Encounter for screening mammogram for malignant neoplasm of breast: Secondary | ICD-10-CM

## 2015-10-04 ENCOUNTER — Ambulatory Visit: Payer: 59

## 2015-10-11 ENCOUNTER — Encounter: Payer: Self-pay | Admitting: Certified Nurse Midwife

## 2015-10-11 ENCOUNTER — Ambulatory Visit (INDEPENDENT_AMBULATORY_CARE_PROVIDER_SITE_OTHER): Payer: 59 | Admitting: Certified Nurse Midwife

## 2015-10-11 ENCOUNTER — Ambulatory Visit
Admission: RE | Admit: 2015-10-11 | Discharge: 2015-10-11 | Disposition: A | Discharge status: Home / Self Care | Payer: 59 | Source: Ambulatory Visit

## 2015-10-11 VITALS — BP 120/62 | HR 74 | Resp 16 | Ht 65.25 in | Wt 212.0 lb

## 2015-10-11 DIAGNOSIS — Z01419 Encounter for gynecological examination (general) (routine) without abnormal findings: Secondary | ICD-10-CM | POA: Diagnosis not present

## 2015-10-11 DIAGNOSIS — Z124 Encounter for screening for malignant neoplasm of cervix: Secondary | ICD-10-CM | POA: Diagnosis not present

## 2015-10-11 DIAGNOSIS — Z1231 Encounter for screening mammogram for malignant neoplasm of breast: Secondary | ICD-10-CM

## 2015-10-11 NOTE — Progress Notes (Signed)
40 y.o. [id number] Married  African American Fe here for annual exam. Periods normal, no issues. Sees Dr.Gherghe for Hypothyroid management. Sees PCP for aex and labs, all normal,last visit.Marland Kitchen No HSV 2 outbreaks in years. Plans to lose this weight this year! No other health issues today.  Patient's last menstrual period was 09/14/2015.          Sexually active: Yes.    The current method of family planning is tubal ligation.    Exercising: No.  exercise Smoker:  no  Health Maintenance: Pap:  09-11-13 neg HPV HR neg MMG:  today Colonoscopy:  none BMD:   none TDaP:  2009 Shingles: no Pneumonia: no Hep C and HIV: HIV for pregnancy 2009 neg Labs: none Self breast exam: done occ   reports that she has never smoked. She does not have any smokeless tobacco history on file. She reports that she does not drink alcohol or use illicit drugs.  Past Medical History  Diagnosis Date  . Complication of anesthesia 2009    failed epidural, severe pain from second attempt  . HSV-2 infection   . Hypothyroid     graves    Past Surgical History  Procedure Laterality Date  . Cesarean section    . Uterine suspension  2007  . Dilation and curettage of uterus  1995    TAB  . Superficial lymph node biopsy / excision    . Laparoscopic tubal ligation  03/21/2012    Procedure: LAPAROSCOPIC TUBAL LIGATION;  Surgeon: Marvene Staff, MD;  Location: [facility];  Service: Gynecology;  Laterality: Bilateral;  with cautery  . Iud removal  03/21/2012    Procedure: INTRAUTERINE DEVICE (IUD) REMOVAL;  Surgeon: Marvene Staff, MD;  Location: [facility];  Service: Gynecology;  Laterality: N/A;  . Hysteroscopy  03/21/2012    Procedure: HYSTEROSCOPY;  Surgeon: Marvene Staff, MD;  Location: [facility];  Service: Gynecology;;  . Tubal ligation      Current Outpatient Prescriptions  Medication Sig Dispense Refill  . cetirizine (ZYRTEC) 10 MG tablet Take 10 mg by mouth daily.    Marland Kitchen SYNTHROID 175 MCG tablet Take 1  tablet (175 mcg total) by mouth daily before breakfast. 90 tablet 3  . tretinoin (RETIN-A) 0.05 % cream as needed. Reported on 10/11/2015  3   No current facility-administered medications for this visit.    Family History  Problem Relation Age of Onset  . Hypertension Mother   . Hypertension Father   . Breast cancer Paternal Aunt   . Thyroid disease Paternal Aunt   . Breast cancer Maternal Aunt   . Thyroid disease Brother   . Stroke Maternal Grandmother   . Stroke Paternal Grandmother   . Thyroid disease Paternal Grandmother   . Thyroid disease Paternal Aunt     ROS:  Pertinent items are noted in HPI.  Otherwise, a comprehensive ROS was negative.  Exam:   BP 120/62 mmHg  Pulse 74  Resp 16  Ht 5' 5.25" (1.657 m)  Wt 212 lb (96.163 kg)  BMI 35.02 kg/m2  LMP 09/14/2015 Height: 5' 5.25" (165.7 cm) Ht Readings from Last 3 Encounters:  10/11/15 5' 5.25" (1.657 m)  09/13/14 5' 5.5" (1.664 m)  05/03/14 5' 5.25" (1.657 m)    General appearance: alert, cooperative and appears stated age Head: Normocephalic, without obvious abnormality, atraumatic Neck: no adenopathy, supple, symmetrical, trachea midline and thyroid normal to inspection and palpation Lungs: clear to auscultation bilaterally Breasts: normal appearance, no masses or  tenderness, No nipple retraction or dimpling, No nipple discharge or bleeding, No axillary or supraclavicular adenopathy Heart: regular rate and rhythm Abdomen: soft, non-tender; no masses,  no organomegaly Extremities: extremities normal, atraumatic, no cyanosis or edema Skin: Skin color, texture, turgor normal. No rashes or lesions Lymph nodes: Cervical, supraclavicular, and axillary nodes normal. No abnormal inguinal nodes palpated Neurologic: Grossly normal   Pelvic: External genitalia:  no lesions              Urethra:  normal appearing urethra with no masses, tenderness or lesions              Bartholin's and Skene's: normal                  Vagina: normal appearing vagina with normal color and discharge, no lesions              Cervix: multiparous appearance, no cervical motion tenderness and no lesions              Pap taken: Yes.   Bimanual Exam:  Uterus:  normal size, contour, position, consistency, mobility, non-tender and mid position              Adnexa: normal adnexa and no mass, fullness, tenderness               Rectovaginal: Confirms               Anus:  normal sphincter tone, no lesions  Chaperone present: yes  A:  Well Woman with normal exam  Contraception BTL  Hypothyroid with Endocrine management  Weight loss in progress  P:   Reviewed health and wellness pertinent to exam  Continue follow with endocrine as indicated  Discussed portion control and decrease in salt and sugar in diet with regular daily exercise for weight loss. Questions addressed.  Pap smear as above with HPVHR   counseled on breast self exam, mammography screening, adequate intake of calcium and vitamin D, diet and exercise  return annually or prn  An After Visit Summary was printed and given to the patient.

## 2015-10-11 NOTE — Patient Instructions (Signed)
EXERCISE AND DIET:  We recommended that you start or continue a regular exercise program for good health. Regular exercise means any activity that makes your heart beat faster and makes you sweat.  We recommend exercising at least 30 minutes per day at least 3 days a week, preferably 4 or 5.  We also recommend a diet low in fat and sugar.  Inactivity, poor dietary choices and obesity can cause diabetes, heart attack, stroke, and kidney damage, among others.    ALCOHOL AND SMOKING:  Women should limit their alcohol intake to no more than 7 drinks/beers/glasses of wine (combined, not each!) per week. Moderation of alcohol intake to this level decreases your risk of breast cancer and liver damage. And of course, no recreational drugs are part of a healthy lifestyle.  And absolutely no smoking or even second hand smoke. Most people know smoking can cause heart and lung diseases, but did you know it also contributes to weakening of your bones? Aging of your skin?  Yellowing of your teeth and nails?  CALCIUM AND VITAMIN D:  Adequate intake of calcium and Vitamin D are recommended.  The recommendations for exact amounts of these supplements seem to change often, but generally speaking 600 mg of calcium (either carbonate or citrate) and 800 units of Vitamin D per day seems prudent. Certain women may benefit from higher intake of Vitamin D.  If you are among these women, your doctor will have told you during your visit.    PAP SMEARS:  Pap smears, to check for cervical cancer or precancers,  have traditionally been done yearly, although recent scientific advances have shown that most women can have pap smears less often.  However, every woman still should have a physical exam from her gynecologist every year. It will include a breast check, inspection of the vulva and vagina to check for abnormal growths or skin changes, a visual exam of the cervix, and then an exam to evaluate the size and shape of the uterus and  ovaries.  And after 40 years of age, a rectal exam is indicated to check for rectal cancers. We will also provide age appropriate advice regarding health maintenance, like when you should have certain vaccines, screening for sexually transmitted diseases, bone density testing, colonoscopy, mammograms, etc.   MAMMOGRAMS:  All women over 40 years old should have a yearly mammogram. Many facilities now offer a "3D" mammogram, which may cost around $50 extra out of pocket. If possible,  we recommend you accept the option to have the 3D mammogram performed.  It both reduces the number of women who will be called back for extra views which then turn out to be normal, and it is better than the routine mammogram at detecting truly abnormal areas.    COLONOSCOPY:  Colonoscopy to screen for colon cancer is recommended for all women at age 50.  We know, you hate the idea of the prep.  We agree, BUT, having colon cancer and not knowing it is worse!!  Colon cancer so often starts as a polyp that can be seen and removed at colonscopy, which can quite literally save your life!  And if your first colonoscopy is normal and you have no family history of colon cancer, most women don't have to have it again for 10 years.  Once every ten years, you can do something that may end up saving your life, right?  We will be happy to help you get it scheduled when you are ready.    Be sure to check your insurance coverage so you understand how much it will cost.  It may be covered as a preventative service at no cost, but you should check your particular policy.     Exercising to Lose Weight Exercising can help you to lose weight. In order to lose weight through exercise, you need to do vigorous-intensity exercise. You can tell that you are exercising with vigorous intensity if you are breathing very hard and fast and cannot hold a conversation while exercising. Moderate-intensity exercise helps to maintain your current weight. You can  tell that you are exercising at a moderate level if you have a higher heart rate and faster breathing, but you are still able to hold a conversation. HOW OFTEN SHOULD I EXERCISE? Choose an activity that you enjoy and set realistic goals. Your health care provider can help you to make an activity plan that works for you. Exercise regularly as directed by your health care provider. This may include:  Doing resistance training twice each week, such as:  Push-ups.  Sit-ups.  Lifting weights.  Using resistance bands.  Doing a given intensity of exercise for a given amount of time. Choose from these options:  150 minutes of moderate-intensity exercise every week.  75 minutes of vigorous-intensity exercise every week.  A mix of moderate-intensity and vigorous-intensity exercise every week. Children, pregnant women, people who are out of shape, people who are overweight, and older adults may need to consult a health care provider for individual recommendations. If you have any sort of medical condition, be sure to consult your health care provider before starting a new exercise program. WHAT ARE SOME ACTIVITIES THAT CAN HELP ME TO LOSE WEIGHT?   Walking at a rate of at least 4.5 miles an hour.  Jogging or running at a rate of 5 miles per hour.  Biking at a rate of at least 10 miles per hour.  Lap swimming.  Roller-skating or in-line skating.  Cross-country skiing.  Vigorous competitive sports, such as football, basketball, and soccer.  Jumping rope.  Aerobic dancing. HOW CAN I BE MORE ACTIVE IN MY DAY-TO-DAY ACTIVITIES?  Use the stairs instead of the elevator.  Take a walk during your lunch break.  If you drive, park your car farther away from work or school.  If you take public transportation, get off one stop early and walk the rest of the way.  Make all of your phone calls while standing up and walking around.  Get up, stretch, and walk around every 30 minutes  throughout the day. WHAT GUIDELINES SHOULD I FOLLOW WHILE EXERCISING?  Do not exercise so much that you hurt yourself, feel dizzy, or get very short of breath.  Consult your health care provider prior to starting a new exercise program.  Wear comfortable clothes and shoes with good support.  Drink plenty of water while you exercise to prevent dehydration or heat stroke. Body water is lost during exercise and must be replaced.  Work out until you breathe faster and your heart beats faster.   This information is not intended to replace advice given to you by your health care provider. Make sure you discuss any questions you have with your health care provider.   Document Released: 07/11/2010 Document Revised: 06/29/2014 Document Reviewed: 11/09/2013 Elsevier Interactive Patient Education 2016 [employer]  

## 2015-10-15 ENCOUNTER — Ambulatory Visit: Payer: 59 | Admitting: Certified Nurse Midwife

## 2015-10-15 LAB — IPS PAP TEST WITH HPV

## 2015-10-17 NOTE — Progress Notes (Signed)
Encounter reviewed Jill Jertson, MD   

## 2016-04-08 ENCOUNTER — Encounter: Payer: Self-pay | Admitting: Nurse Practitioner

## 2016-04-08 ENCOUNTER — Ambulatory Visit (INDEPENDENT_AMBULATORY_CARE_PROVIDER_SITE_OTHER): Payer: 59 | Admitting: Nurse Practitioner

## 2016-04-08 ENCOUNTER — Ambulatory Visit (INDEPENDENT_AMBULATORY_CARE_PROVIDER_SITE_OTHER)
Admission: RE | Admit: 2016-04-08 | Discharge: 2016-04-08 | Disposition: A | Discharge status: Home / Self Care | Payer: 59 | Source: Ambulatory Visit | Attending: Nurse Practitioner | Admitting: Nurse Practitioner

## 2016-04-08 ENCOUNTER — Other Ambulatory Visit (INDEPENDENT_AMBULATORY_CARE_PROVIDER_SITE_OTHER): Payer: 59

## 2016-04-08 VITALS — BP 126/82 | HR 98 | Wt 208.0 lb

## 2016-04-08 DIAGNOSIS — R5383 Other fatigue: Secondary | ICD-10-CM

## 2016-04-08 DIAGNOSIS — Z23 Encounter for immunization: Secondary | ICD-10-CM | POA: Diagnosis not present

## 2016-04-08 DIAGNOSIS — M25551 Pain in right hip: Secondary | ICD-10-CM

## 2016-04-08 DIAGNOSIS — E559 Vitamin D deficiency, unspecified: Secondary | ICD-10-CM

## 2016-04-08 DIAGNOSIS — M545 Low back pain, unspecified: Secondary | ICD-10-CM | POA: Insufficient documentation

## 2016-04-08 DIAGNOSIS — E785 Hyperlipidemia, unspecified: Secondary | ICD-10-CM

## 2016-04-08 LAB — CBC WITH DIFFERENTIAL/PLATELET
BASOS ABS: 0 10*3/uL (ref 0.0–0.1)
Basophils Relative: 0.5 % (ref 0.0–3.0)
EOS ABS: 0.1 10*3/uL (ref 0.0–0.7)
Eosinophils Relative: 0.8 % (ref 0.0–5.0)
HCT: 36.9 % (ref 36.0–46.0)
Hemoglobin: 12.3 g/dL (ref 12.0–15.0)
LYMPHS ABS: 1.8 10*3/uL (ref 0.7–4.0)
Lymphocytes Relative: 24.2 % (ref 12.0–46.0)
MCHC: 33.3 g/dL (ref 30.0–36.0)
MCV: 85 fl (ref 78.0–100.0)
Monocytes Absolute: 0.4 10*3/uL (ref 0.1–1.0)
Monocytes Relative: 5.6 % (ref 3.0–12.0)
NEUTROS ABS: 5.1 10*3/uL (ref 1.4–7.7)
NEUTROS PCT: 68.9 % (ref 43.0–77.0)
PLATELETS: 265 10*3/uL (ref 150.0–400.0)
RBC: 4.35 Mil/uL (ref 3.87–5.11)
RDW: 14 % (ref 11.5–15.5)
WBC: 7.4 10*3/uL (ref 4.0–10.5)

## 2016-04-08 LAB — COMPREHENSIVE METABOLIC PANEL
ALT: 15 U/L (ref 0–35)
AST: 20 U/L (ref 0–37)
Albumin: 4.5 g/dL (ref 3.5–5.2)
Alkaline Phosphatase: 51 U/L (ref 39–117)
BILIRUBIN TOTAL: 0.5 mg/dL (ref 0.2–1.2)
BUN: 14 mg/dL (ref 6–23)
CO2: 26 meq/L (ref 19–32)
CREATININE: 0.86 mg/dL (ref 0.40–1.20)
Calcium: 9.8 mg/dL (ref 8.4–10.5)
Chloride: 102 mEq/L (ref 96–112)
GFR: 93.67 mL/min (ref >60.00)
GLUCOSE: 76 mg/dL (ref 70–99)
Potassium: 3.7 mEq/L (ref 3.5–5.1)
SODIUM: 136 meq/L (ref 135–145)
Total Protein: 8.1 g/dL (ref 6.0–8.3)

## 2016-04-08 LAB — LIPID PANEL
CHOL/HDL RATIO: 4
Cholesterol: 280 mg/dL — ABNORMAL HIGH (ref 0–200)
HDL: 62.4 mg/dL (ref >39.00)
LDL Cholesterol: 197 mg/dL — ABNORMAL HIGH (ref 0–99)
NONHDL: 217.11
Triglycerides: 100 mg/dL (ref 0.0–149.0)
VLDL: 20 mg/dL (ref 0.0–40.0)

## 2016-04-08 LAB — VITAMIN B12: Vitamin B-12: 312 pg/mL (ref 211–911)

## 2016-04-08 MED ORDER — ROSUVASTATIN CALCIUM 10 MG PO TABS: 10.0000 mg | ORAL_TABLET | Freq: Every day | ORAL | 3 refills | Status: DC

## 2016-04-08 NOTE — Progress Notes (Signed)
Pre visit review using our clinic review tool, if applicable. No additional management support is needed unless otherwise documented below in the visit note. 

## 2016-04-08 NOTE — Progress Notes (Addendum)
Subjective:  Patient ID: Jordan [name], female    DOB: January 21, 1976  Age: 40 y.o. MRN: [id number]  CC: Hip Pain (74month and Fatigue (chronic, getting worse)   Hip Pain   The incident occurred more than 1 week ago (right posterior hip pain). There was no injury mechanism. The pain is present in the right hip. The quality of the pain is described as aching and stabbing. The pain is moderate. The pain has been fluctuating since onset. Pertinent negatives include no inability to bear weight, loss of motion, loss of sensation, muscle weakness, numbness or tingling. Exacerbated by: prolonged sitting. She has tried NSAIDs for the symptoms. The treatment provided mild relief.   Fatigue: Also complains of chronic fatigue, ongoing for over 1year, improved while taking Vitamin D supplement. No fever, no joint pain or stiffness, no joint swelling. No FH of autoimmune disease. No recent change in synthroid dose. Last seen by endocrinologist 07/2015, advised to f/up in 1year. Denies any acute URI, nor GI and GU function, no unintentional weight loss, no change in appetite.  Outpatient Medications Prior to Visit  Medication Sig Dispense Refill  . cetirizine (ZYRTEC) 10 MG tablet Take 10 mg by mouth daily.    .Marland KitchenSYNTHROID 175 MCG tablet Take 1 tablet (175 mcg total) by mouth daily before breakfast. 90 tablet 3  . tretinoin (RETIN-A) 0.05 % cream as needed. Reported on 10/11/2015  3   No facility-administered medications prior to visit.    Social History   Social History  . Marital status: Married    Spouse name: N/A  . Number of children: N/A  . Years of education: N/A   Social History Main Topics  . Smoking status: Never Smoker  . Smokeless tobacco: Never Used  . Alcohol use No  . Drug use: No  . Sexual activity: Yes    Partners: Male    Birth control/ protection: Surgical     Comment: tubal ligation   Other Topics Concern  . None   Social History Narrative  . None   ROS See  HPI  Objective:  BP 126/82 (BP Location: Left Arm, Patient Position: Sitting, Cuff Size: Normal)   Pulse 98   Wt 208 lb (94.3 kg)   LMP 04/08/2016   SpO2 97%   BMI 34.35 kg/m   BP Readings from Last 3 Encounters:  04/08/16 126/82  10/11/15 120/62  08/20/15 120/60    Wt Readings from Last 3 Encounters:  04/08/16 208 lb (94.3 kg)  10/11/15 212 lb (96.2 kg)  08/20/15 210 lb (95.3 kg)    Physical Exam  Constitutional: She is oriented to person, place, and time. No distress.  HENT:  Right Ear: External ear normal.  Left Ear: External ear normal.  Nose: Nose normal.  Mouth/Throat: No oropharyngeal exudate.  Eyes: No scleral icterus.  Neck: Normal range of motion. Neck supple.  Cardiovascular: Normal rate, regular rhythm and normal heart sounds.   Pulmonary/Chest: Effort normal and breath sounds normal. No respiratory distress.  Abdominal: Soft. Bowel sounds are normal. She exhibits no distension. There is no tenderness.  Musculoskeletal: Normal range of motion. She exhibits no edema or tenderness.       Right hip: Normal.       Left hip: Normal.       Lumbar back: Normal.       Right upper leg: Normal.       Left upper leg: Normal.  Lymphadenopathy:    She has no cervical adenopathy.  Neurological: She is alert and oriented to person, place, and time.  Skin: Skin is warm and dry.  Psychiatric: She has a normal mood and affect. Her behavior is normal.    Lab Results  Component Value Date   WBC 6.1 03/11/2012   HGB 12.4 09/13/2014   HCT 34.7 (L) 03/11/2012   PLT 288 03/11/2012   GLUCOSE 79 09/13/2014   CHOL 249 (H) 09/13/2014   TRIG 100 09/13/2014   HDL 53 09/13/2014   LDLCALC 176 (H) 09/13/2014   ALT 15 09/13/2014   AST 20 09/13/2014   NA 137 09/13/2014   K 4.0 09/13/2014   CL 102 09/13/2014   CREATININE 0.80 09/13/2014   BUN 10 09/13/2014   CO2 26 09/13/2014   TSH 3.28 08/20/2015    No results found.  Assessment & Plan:   Telma was seen today for  hip pain and fatigue.  Diagnoses and all orders for this visit:  Hip pain, acute, right -     DG HIP UNILAT WITH PELVIS 2-3 VIEWS RIGHT; Future  Need for prophylactic vaccination and inoculation against influenza -     Flu Vaccine QUAD 36+ mos IM -     CBC w/Diff; Future -     Comp Met (CMET); Future -     Lipid panel; Future  Vitamin D deficiency -     Vitamin D 1,25 dihydroxy; Future  Fatigue, unspecified type -     CBC w/Diff; Future -     Comp Met (CMET); Future -     Vitamin B12; Future   I am having Ms. Docken maintain her tretinoin, SYNTHROID, and cetirizine.  No orders of the defined types were placed in this encounter.  Recent Results (from the past 2160 hour(s))  CBC w/Diff     Status: None   Collection Time: 04/08/16 12:31 PM  Result Value Ref Range   WBC 7.4 4.0 - 10.5 K/uL   RBC 4.35 3.87 - 5.11 Mil/uL   Hemoglobin 12.3 12.0 - 15.0 g/dL   HCT 36.9 36.0 - 46.0 %   MCV 85.0 78.0 - 100.0 fl   MCHC 33.3 30.0 - 36.0 g/dL   RDW 14.0 11.5 - 15.5 %   Platelets 265.0 150.0 - 400.0 K/uL   Neutrophils Relative % 68.9 43.0 - 77.0 %   Lymphocytes Relative 24.2 12.0 - 46.0 %   Monocytes Relative 5.6 3.0 - 12.0 %   Eosinophils Relative 0.8 0.0 - 5.0 %   Basophils Relative 0.5 0.0 - 3.0 %   Neutro Abs 5.1 1.4 - 7.7 K/uL   Lymphs Abs 1.8 0.7 - 4.0 K/uL   Monocytes Absolute 0.4 0.1 - 1.0 K/uL   Eosinophils Absolute 0.1 0.0 - 0.7 K/uL   Basophils Absolute 0.0 0.0 - 0.1 K/uL  Comp Met (CMET)     Status: None   Collection Time: 04/08/16 12:31 PM  Result Value Ref Range   Sodium 136 135 - 145 mEq/L   Potassium 3.7 3.5 - 5.1 mEq/L   Chloride 102 96 - 112 mEq/L   CO2 26 19 - 32 mEq/L   Glucose, Bld 76 70 - 99 mg/dL   BUN 14 6 - 23 mg/dL   Creatinine, Ser 0.86 0.40 - 1.20 mg/dL   Total Bilirubin 0.5 0.2 - 1.2 mg/dL   Alkaline Phosphatase 51 39 - 117 U/L   AST 20 0 - 37 U/L   ALT 15 0 - 35 U/L   Total Protein 8.1 6.0 -  8.3 g/dL   Albumin 4.5 3.5 - 5.2 g/dL    Calcium 9.8 8.4 - 10.5 mg/dL   GFR 93.67 >60.00 mL/min  Lipid panel     Status: Abnormal   Collection Time: 04/08/16 12:31 PM  Result Value Ref Range   Cholesterol 280 (H) 0 - 200 mg/dL    Comment: ATP III Classification       Desirable:  < 200 mg/dL               Borderline High:  200 - 239 mg/dL          High:  > = 240 mg/dL   Triglycerides 100.0 0.0 - 149.0 mg/dL    Comment: Normal:  <150 mg/dLBorderline High:  150 - 199 mg/dL   HDL 62.40 >39.00 mg/dL   VLDL 20.0 0.0 - 40.0 mg/dL   LDL Cholesterol 197 (H) 0 - 99 mg/dL   Total CHOL/HDL Ratio 4     Comment:                Men          Women1/2 Average Risk     3.4          3.3Average Risk          5.0          4.42X Average Risk          9.6          7.13X Average Risk          15.0          11.0                       NonHDL 217.11     Comment: NOTE:  Non-HDL goal should be 30 mg/dL higher than patient's LDL goal (i.e. LDL goal of < 70 mg/dL, would have non-HDL goal of < 100 mg/dL)  Vitamin B12     Status: None   Collection Time: 04/08/16 12:31 PM  Result Value Ref Range   Vitamin B-12 312 211 - 911 pg/mL    Follow-up: Return in about 3 months (around 07/09/2016) for fatigue and hip pain.  Wilfred Lacy, NP

## 2016-04-08 NOTE — Patient Instructions (Addendum)
Go to basement for lab draw and x-ray. Will call with results.  Normal right hip and pelvis x-ray. Refer to sports medicine if pain persist.  Encourage heart healthy diet and regular exercise.

## 2016-04-08 NOTE — Addendum Note (Signed)
Addended by: Wilfred Lacy L on: 04/08/2016 05:14 PM   Modules accepted: Orders

## 2016-04-10 LAB — VITAMIN D 1,25 DIHYDROXY
VITAMIN D2 1, 25 (OH): 16 pg/mL
VITAMIN D3 1, 25 (OH): 38 pg/mL
Vitamin D 1, 25 (OH)2 Total: 54 pg/mL (ref 18–72)

## 2016-04-13 NOTE — Progress Notes (Signed)
Normal results, see office note

## 2016-07-09 ENCOUNTER — Ambulatory Visit: Payer: Self-pay | Admitting: Nurse Practitioner

## 2016-08-20 ENCOUNTER — Ambulatory Visit: Payer: 59 | Admitting: Internal Medicine

## 2016-09-28 ENCOUNTER — Ambulatory Visit: Payer: 59 | Admitting: Internal Medicine

## 2016-10-14 ENCOUNTER — Ambulatory Visit: Payer: 59 | Admitting: Certified Nurse Midwife

## 2016-11-10 ENCOUNTER — Ambulatory Visit (INDEPENDENT_AMBULATORY_CARE_PROVIDER_SITE_OTHER): Payer: 59 | Admitting: Certified Nurse Midwife

## 2016-11-10 ENCOUNTER — Encounter: Payer: Self-pay | Admitting: Certified Nurse Midwife

## 2016-11-10 VITALS — BP 106/70 | HR 70 | Resp 16 | Ht 65.5 in | Wt 207.0 lb

## 2016-11-10 DIAGNOSIS — Z01419 Encounter for gynecological examination (general) (routine) without abnormal findings: Secondary | ICD-10-CM | POA: Diagnosis not present

## 2016-11-10 DIAGNOSIS — E039 Hypothyroidism, unspecified: Secondary | ICD-10-CM

## 2016-11-10 NOTE — Progress Notes (Signed)
41 y.o. L2G4010 Married  African American Fe here for annual exam.  Periods normal except occasional more cramping. Amount of bleeding same, no increase. Hypothyroid stable with endocrine management. Established  With PCP with [facility] for aex, labs and hypothyroid management.  All labs normal per patient. Planning trip for 10 wedding anniversary!  Patient's last menstrual period was 10/27/2016 (exact date).          Sexually active: Yes.    The current method of family planning is tubal ligation.    Exercising: No.  exercise Smoker:  no  Health Maintenance: Pap:  09-11-13 neg HPV HR neg, 10-11-15 neg HPV HR neg History of Abnormal Pap: no MMG:  10-11-15 category b density birads 1:neg Self Breast exams: occ Colonoscopy:  none BMD:   none TDaP:  2009 Shingles: no Pneumonia: no Hep C and HIV: HIV neg 2009 (pregnancy) Labs: none   reports that she has never smoked. She has never used smokeless tobacco. She reports that she does not drink alcohol or use drugs.  Past Medical History:  Diagnosis Date  . Complication of anesthesia 2009   failed epidural, severe pain from second attempt  . HSV-2 infection   . Hypothyroid    graves    Past Surgical History:  Procedure Laterality Date  . CESAREAN SECTION    . DILATION AND CURETTAGE OF UTERUS  1995   TAB  . HYSTEROSCOPY  03/21/2012   Procedure: HYSTEROSCOPY;  Surgeon: Marvene Staff, MD;  Location: [facility];  Service: Gynecology;;  . IUD REMOVAL  03/21/2012   Procedure: INTRAUTERINE DEVICE (IUD) REMOVAL;  Surgeon: Marvene Staff, MD;  Location: [facility];  Service: Gynecology;  Laterality: N/A;  . LAPAROSCOPIC TUBAL LIGATION  03/21/2012   Procedure: LAPAROSCOPIC TUBAL LIGATION;  Surgeon: Marvene Staff, MD;  Location: [facility];  Service: Gynecology;  Laterality: Bilateral;  with cautery  . SUPERFICIAL LYMPH NODE BIOPSY / EXCISION    . TUBAL LIGATION    . UTERINE SUSPENSION  2007    Current Outpatient Prescriptions   Medication Sig Dispense Refill  . cetirizine (ZYRTEC) 10 MG tablet Take 10 mg by mouth daily.    Marland Kitchen SYNTHROID 175 MCG tablet Take 1 tablet (175 mcg total) by mouth daily before breakfast. 90 tablet 3   No current facility-administered medications for this visit.     Family History  Problem Relation Age of Onset  . Hypertension Mother   . Hypertension Father   . Breast cancer Paternal Aunt   . Thyroid disease Paternal Aunt   . Breast cancer Maternal Aunt   . Thyroid disease Brother   . Stroke Maternal Grandmother   . Stroke Paternal Grandmother   . Thyroid disease Paternal Grandmother   . Thyroid disease Paternal Aunt     ROS:  Pertinent items are noted in HPI.  Otherwise, a comprehensive ROS was negative.  Exam:   BP 106/70   Pulse 70   Resp 16   Ht 5' 5.5" (1.664 m)   Wt 207 lb (93.9 kg)   LMP 10/27/2016 (Exact Date)   BMI 33.92 kg/m  Height: 5' 5.5" (166.4 cm) Ht Readings from Last 3 Encounters:  11/10/16 5' 5.5" (1.664 m)  10/11/15 5' 5.25" (1.657 m)  09/13/14 5' 5.5" (1.664 m)    General appearance: alert, cooperative and appears stated age Head: Normocephalic, without obvious abnormality, atraumatic Neck: no adenopathy, supple, symmetrical, trachea midline and thyroid normal to inspection and palpation Lungs: clear to auscultation bilaterally Breasts:  normal appearance, no masses or tenderness, No nipple retraction or dimpling, No nipple discharge or bleeding, No axillary or supraclavicular adenopathy Heart: regular rate and rhythm Abdomen: soft, non-tender; no masses,  no organomegaly Extremities: extremities normal, atraumatic, no cyanosis or edema Skin: Skin color, texture, turgor normal. No rashes or lesions Lymph nodes: Cervical, supraclavicular, and axillary nodes normal. No abnormal inguinal nodes palpated Neurologic: Grossly normal   Pelvic: External genitalia:  no lesions              Urethra:  normal appearing urethra with no masses, tenderness or  lesions              Bartholin's and Skene's: normal                 Vagina: normal appearing vagina with normal color and discharge, no lesions              Cervix: multiparous appearance, no cervical motion tenderness and no lesions              Pap taken: No. Bimanual Exam:  Uterus:  normal size, contour, position, consistency, mobility, non-tender              Adnexa: normal adnexa and no mass, fullness, tenderness               Rectovaginal: Confirms               Anus:  normal sphincter tone, no lesions  Chaperone present: yes  A:  Well Woman with normal exam  Contraception Tubal ligation  Obese working on weight loss, down 5 pounds  Hypothyroid with PCP management  P:   Reviewed health and wellness pertinent to exam  Encouraged to continue weight loss journey and regular pattern. Discussed avoiding prepared foods due to increase of salt and sugar intake. Questions addressed.  Continue with PCP management as indicated  Pap smear: no   counseled on breast self exam, mammography screening, adequate intake of calcium and vitamin D, diet and exercise  return annually or prn  An After Visit Summary was printed and given to the patient.

## 2016-11-10 NOTE — Patient Instructions (Signed)

## 2016-11-15 ENCOUNTER — Other Ambulatory Visit: Payer: Self-pay | Admitting: Internal Medicine

## 2016-11-17 NOTE — Telephone Encounter (Signed)
Submitted

## 2016-11-17 NOTE — Telephone Encounter (Signed)
OK to refill for the next 3 mo

## 2016-11-17 NOTE — Telephone Encounter (Signed)
Okay to refill last seen 07/2015. Cancelled last two appointments.

## 2016-12-14 ENCOUNTER — Other Ambulatory Visit: Payer: Self-pay | Admitting: Certified Nurse Midwife

## 2016-12-14 DIAGNOSIS — Z1231 Encounter for screening mammogram for malignant neoplasm of breast: Secondary | ICD-10-CM

## 2016-12-16 ENCOUNTER — Encounter: Payer: Self-pay | Admitting: Family Medicine

## 2016-12-16 ENCOUNTER — Ambulatory Visit (INDEPENDENT_AMBULATORY_CARE_PROVIDER_SITE_OTHER): Payer: 59 | Admitting: Family Medicine

## 2016-12-16 VITALS — BP 138/84 | HR 100 | Temp 98.7°F | Resp 12 | Ht 65.5 in | Wt 208.0 lb

## 2016-12-16 DIAGNOSIS — T63481A Toxic effect of venom of other arthropod, accidental (unintentional), initial encounter: Secondary | ICD-10-CM | POA: Diagnosis not present

## 2016-12-16 NOTE — Patient Instructions (Signed)
Larger area- 6 cm x 5.5 cm  Smaller area toward calf- 3 x 5 cm  Take 2-3 ibuprofen every 8 to 12 hours as needed for pain   Bee, Wasp, or Hornet Sting, Adult Bees, wasps, and hornets are part of a family of insects that can sting people. These stings can cause pain and inflammation, but they are usually not serious. However, some people may have an allergic reaction to a sting. This can cause the symptoms to be more severe. What increases the risk? You may be at a greater risk of getting stung if you:  Provoke a stinging insect by swatting or disturbing it.  Wear strong-smelling soaps, deodorants, or body sprays.  Spend time outdoors near gardens with flowers or fruit trees or in clothes that expose skin.  Eat or drink outside.  What are the signs or symptoms? Common symptoms of this condition include:  A red lump in the skin that sometimes has a tiny hole in the center. In some cases, a stinger may be in the center of the wound.  Pain and itching at the sting site.  Redness and swelling around the sting site. If you have an allergic reaction (localized allergic reaction), the swelling and redness may spread out from the sting site. In some cases, this reaction can continue to develop over the next 24-48 hours.  In rare cases, a person may have a severe allergic reaction (anaphylactic reaction) to a sting. Symptoms of an anaphylactic reaction may include:  Wheezing or difficulty breathing.  Raised, itchy, red patches on the skin (hives).  Nausea or vomiting.  Abdominal cramping.  Diarrhea.  Tightness in the chest or chest pain.  Dizziness or fainting.  Redness of the face (flushing).  Hoarse voice.  Swollen tongue, lips, or face.  How is this diagnosed? This condition is usually diagnosed based on your symptoms and medical history as well as a physical exam. You may have an allergy test to determine if you are allergic to the substance that the insect injected  during the sting (venom). How is this treated? If you were stung by a bee, the stinger and a small sac of venom may be in the wound. It is important to remove the stinger as soon as possible. You can do this by brushing across the wound with gauze, a fingernail, or a flat card such as a credit card. Removing the stinger can help reduce the severity of your body's reaction to the sting. Most stings can be treated with:  Icing to reduce swelling in the area.  Medicines (antihistamines) to treat itching or an allergic reaction.  Medicines to help reduce pain. These may be medicines that you take by mouth, or medicated creams or lotions that you apply to your skin.  Pay close attention to your symptoms after you have been stung. If possible, have someone stay with you to make sure you do not have an allergic reaction. If you have any signs of an allergic reaction, call your health care provider. If you have ever had a severe allergic reaction, your health care provider may give you an inhaler or injectable medicine (epinephrine auto-injector) to use if necessary. Follow these instructions at home:  Wash the sting site 2-3 times each day with soap and water as told by your health care provider.  Apply or take over-the-counter and prescription medicines only as told by your health care provider.  If directed, apply ice to the sting area. ? Put ice in  a plastic bag. ? Place a towel between your skin and the bag. ? Leave the ice on for 20 minutes, 2-3 times a day.  Do not scratch the sting area.  If you had a severe allergic reaction to a sting, you may need: ? To wear a medical bracelet or necklace that lists the allergy. ? To learn when and how to use an anaphylaxis kit or epinephrine injection. Your family members and coworkers may also need to learn this. ? To carry an anaphylaxis kit or epinephrine injection with you at all times. How is this prevented?  Avoid swatting at stinging insects  and disturbing insect nests.  Do not use fragrant soaps or lotions.  Wear shoes, pants, and long sleeves when spending time outdoors, especially in grassy areas where stinging insects are common.  Keep outdoor areas free from nests or hives.  Keep food and drink containers covered when eating outdoors.  Avoid working or sitting near [employer], if possible.  Wear gloves if you are gardening or working outdoors.  If an attack by a stinging insect or a swarm seems likely in the moment, move away from the area or find a barrier between you and the insect(s), such as a door. Contact a health care provider if:  Your symptoms do not get better in 2-3 days.  You have redness, swelling, or pain that spreads beyond the area of the sting.  You have a fever. Get help right away if: You have symptoms of a severe allergic reaction. These include:  Wheezing or difficulty breathing.  Tightness in the chest or chest pain.  Light-headedness or fainting.  Itchy, raised, red patches on the skin.  Nausea or vomiting.  Abdominal cramping.  Diarrhea.  A swollen tongue or lips, or trouble swallowing.  Dizziness or fainting.  Summary  Stings from bees, wasps, and hornets can cause pain and inflammation, but they are usually not serious. However, some people may have an allergic reaction to a sting. This can cause the symptoms to be more severe.  Pay close attention to your symptoms after you have been stung. If possible, have someone stay with you to make sure you do not have an allergic reaction.  Call your health care provider if you have any signs of an allergic reaction. This information is not intended to replace advice given to you by your health care provider. Make sure you discuss any questions you have with your health care provider. Document Released: 06/08/2005 Document Revised: 08/13/2016 Document Reviewed: 08/13/2016 Elsevier Interactive Patient Education  [employer].

## 2016-12-16 NOTE — Progress Notes (Signed)
Subjective:    Patient ID: Jordan [name], female    DOB: 1976/02/13, 41 y.o.   MRN: [id number]  HPI This is a 41 yo female who presents today following 2 wasp stings 4 days ago. She was planting flowers near her porch when she was attacked by two wasps that were under her rocking chair. She saw them sting her right leg. She has had pain and redness since then. Has applied some benadryl cream and has taken some acetaminophen with temporary relief. No streaking or drainage. No facial swelling or sensation of throat swelling.   Past Medical History:  Diagnosis Date  . Complication of anesthesia 2009   failed epidural, severe pain from second attempt  . HSV-2 infection   . Hypothyroid    graves   Past Surgical History:  Procedure Laterality Date  . CESAREAN SECTION    . DILATION AND CURETTAGE OF UTERUS  1995   TAB  . HYSTEROSCOPY  03/21/2012   Procedure: HYSTEROSCOPY;  Surgeon: Marvene Staff, MD;  Location: [facility];  Service: Gynecology;;  . IUD REMOVAL  03/21/2012   Procedure: INTRAUTERINE DEVICE (IUD) REMOVAL;  Surgeon: Marvene Staff, MD;  Location: [facility];  Service: Gynecology;  Laterality: N/A;  . LAPAROSCOPIC TUBAL LIGATION  03/21/2012   Procedure: LAPAROSCOPIC TUBAL LIGATION;  Surgeon: Marvene Staff, MD;  Location: [facility];  Service: Gynecology;  Laterality: Bilateral;  with cautery  . SUPERFICIAL LYMPH NODE BIOPSY / EXCISION    . TUBAL LIGATION    . UTERINE SUSPENSION  2007   Family History  Problem Relation Age of Onset  . Hypertension Mother   . Hypertension Father   . Breast cancer Paternal Aunt   . Thyroid disease Paternal Aunt   . Breast cancer Maternal Aunt   . Thyroid disease Brother   . Stroke Maternal Grandmother   . Stroke Paternal Grandmother   . Thyroid disease Paternal Grandmother   . Thyroid disease Paternal Aunt       Review of Systems Per HPI    Objective:   Physical Exam  Constitutional: She is oriented to person, place, and  time. She appears well-developed and well-nourished. No distress.  HENT:  Head: Normocephalic and atraumatic.  Eyes: Conjunctivae are normal.  Cardiovascular: Normal rate.   Pulmonary/Chest: Effort normal.  Neurological: She is alert and oriented to person, place, and time.  Skin: Skin is warm and dry. She is not diaphoretic.     Psychiatric: She has a normal mood and affect. Her behavior is normal. Judgment and thought content normal.  Vitals reviewed.     BP 138/84 (BP Location: Left Arm, Patient Position: Sitting, Cuff Size: Normal)   Pulse 100   Temp 98.7 F (37.1 C) (Oral)   Resp 12   Ht 5' 5.5" (1.664 m)   Wt 208 lb (94.3 kg)   LMP 11/27/2016 (Approximate)   SpO2 99%   BMI 34.09 kg/m  Wt Readings from Last 3 Encounters:  12/16/16 208 lb (94.3 kg)  11/10/16 207 lb (93.9 kg)  04/08/16 208 lb (94.3 kg)      Assessment & Plan:  1. Insect stings, accidental or unintentional, initial encounter - Provided written and verbal information regarding diagnosis and treatment. - ibuprofen 400-600 mg po q 8-12 hours - RTC precautions reviewed, she will let me know if not better in a couple of days   Clarene Reamer, FNP-BC  [facility]  12/16/2016  3:33 PM

## 2016-12-17 ENCOUNTER — Ambulatory Visit
Admission: RE | Admit: 2016-12-17 | Discharge: 2016-12-17 | Disposition: A | Discharge status: Home / Self Care | Payer: 59 | Source: Ambulatory Visit | Attending: Certified Nurse Midwife | Admitting: Certified Nurse Midwife

## 2016-12-17 DIAGNOSIS — Z1231 Encounter for screening mammogram for malignant neoplasm of breast: Secondary | ICD-10-CM

## 2017-02-01 ENCOUNTER — Ambulatory Visit: Payer: Self-pay | Admitting: Nurse Practitioner

## 2017-02-09 ENCOUNTER — Encounter: Payer: Self-pay | Admitting: Internal Medicine

## 2017-02-09 ENCOUNTER — Ambulatory Visit (INDEPENDENT_AMBULATORY_CARE_PROVIDER_SITE_OTHER): Payer: 59 | Admitting: Internal Medicine

## 2017-02-09 VITALS — BP 132/82 | HR 85 | Wt 199.0 lb

## 2017-02-09 DIAGNOSIS — Z6832 Body mass index (BMI) 32.0-32.9, adult: Secondary | ICD-10-CM

## 2017-02-09 DIAGNOSIS — E890 Postprocedural hypothyroidism: Secondary | ICD-10-CM

## 2017-02-09 DIAGNOSIS — E661 Drug-induced obesity: Secondary | ICD-10-CM

## 2017-02-09 LAB — T4, FREE: Free T4: 1.01 ng/dL (ref 0.60–1.60)

## 2017-02-09 LAB — TSH: TSH: 2.54 u[IU]/mL (ref 0.35–4.50)

## 2017-02-09 NOTE — Progress Notes (Signed)
Subjective:     Patient ID: Jordan [name], female   DOB: 1975-10-19, 41 y.o.   MRN: [id number]  HPI [name] is a 41 y.o. woman, returning for followup for hypothyroidism post RAI ablation for Graves' disease in 1997. Last visit 1.5 years ago.  She started boot camp 1 mo ago >> lost 10 lbs. She also cut down bread and cut out juices.  Reviewed TFTs - TFTs normalized after she started to take LT4 correctly: Lab Results  Component Value Date   TSH 3.28 08/20/2015   TSH 0.85 02/18/2015   TSH 0.36 10/18/2014   TSH 40.089 (H) 09/13/2014   TSH 0.58 03/03/2013   TSH 9.98 (H) 11/25/2012   FREET4 0.94 08/20/2015   FREET4 0.90 02/18/2015   FREET4 0.91 10/18/2014   FREET4 1.01 03/03/2013   FREET4 0.63 11/25/2012   Pt is on Synthroid 175 mcg daily, taken: - in am - fasting - at least >30 min from b'fast - no Ca, Fe, PPIs - + MVI at night - not on Biotin  Pt denies: - feeling nodules in neck - hoarseness - dysphagia - choking - SOB with lying down  No FH of thyroid cancer. No h/o radiation tx to head or neck.  No seaweed or kelp. No recent contrast studies. No herbal supplements. No Biotin use. No recent steroids use.   Review of Systems Constitutional: no weight gain/no weight loss, no fatigue, no subjective hyperthermia, no subjective hypothermia Eyes: no blurry vision, no xerophthalmia ENT: no sore throat, no nodules palpated in throat, no dysphagia, no odynophagia, no hoarseness Cardiovascular: no CP/no SOB/no palpitations/no leg swelling Respiratory: no cough/no SOB/no wheezing Gastrointestinal: no N/no V/no D/no C/no acid reflux Musculoskeletal: no muscle aches/+ joint aches Skin: no rashes, no hair loss Neurological: no tremors/no numbness/no tingling/no dizziness  I reviewed pt's medications, allergies, PMH, social hx, family hx, and changes were documented in the history of present illness. Otherwise, unchanged from my initial visit note.  Objective:   Physical Exam BP 132/82 (BP Location: Left Arm, Patient Position: Sitting)   Pulse 85   Wt 199 lb (90.3 kg)   LMP 02/07/2017   SpO2 97%   BMI 32.61 kg/m  Body mass index is 32.61 kg/m. Wt Readings from Last 3 Encounters:  02/09/17 199 lb (90.3 kg)  12/16/16 208 lb (94.3 kg)  11/10/16 207 lb (93.9 kg)   Constitutional: overweight, in NAD Eyes: PERRLA, EOMI, no exophthalmos ENT: moist mucous membranes, no thyromegaly, no cervical lymphadenopathy Cardiovascular: RRR, No MRG Respiratory: CTA B Gastrointestinal: abdomen soft, NT, ND, BS+ Musculoskeletal: no deformities, strength intact in all 4 Skin: moist, warm, no rashes Neurological: no tremor with outstretched hands, DTR normal in all 4  Assessment:     1. Iatrogenic hypothyroidism - post ablation for Graves ds in 1997 - on Synthroid DAW  2. Obesity class 1 BMI Classification:  < 18.5 underweight   18.5-24.9 normal weight   25.0-29.9 overweight   30.0-34.9 class I obesity   35.0-39.9 class II obesity   ? 40.0 class III obesity      Plan:     Pt with long-standing Post-ablative hypothyroidism, previously with variable control due to noncompliance with her levothyroxine, now with normal TFTs after she started to take the medication as prescribed. - latest thyroid labs reviewed with pt >> normal 1.5 years ago - she continues on LT4 175 mcg daily - pt feels good on this dose. - we discussed about taking the thyroid hormone every  day, with water, >30 minutes before breakfast, separated by >4 hours from acid reflux medications, calcium, iron, multivitamins. Pt. is taking it correctly - will check thyroid tests today: TSH and fT4 - If labs are abnormal, she will need to return for repeat TFTs in 1.5 months - OTW, RTC in 1 year  2. Obesity class 1 - Patient started boot camp exercise class 1 month ago and also improved her diet by eliminating juices >> she already lost 10 pounds. She is determined to continue with  exercise - I congratulated the patient is strongly advised her to continue  Needs refills.  Component     Latest Ref Rng & Units 02/09/2017  TSH     0.35 - 4.50 uIU/mL 2.54  T4,Free(Direct)     0.60 - 1.60 ng/dL 1.01  Normal TFTs.  Philemon Kingdom, MD PhD [facility]

## 2017-02-09 NOTE — Patient Instructions (Signed)
Please stop at the lab.  Please continue Synthroid 175 mcg daily.  Take the thyroid hormone every day, with water, at least 30 minutes before breakfast, separated by at least 4 hours from: - acid reflux medications - calcium - iron - multivitamins  Please return in 1 year.

## 2017-02-10 MED ORDER — SYNTHROID 175 MCG PO TABS: 175.0000 ug | ORAL_TABLET | Freq: Every day | ORAL | 3 refills | Status: DC

## 2017-02-19 ENCOUNTER — Ambulatory Visit (INDEPENDENT_AMBULATORY_CARE_PROVIDER_SITE_OTHER): Payer: 59 | Admitting: Nurse Practitioner

## 2017-02-19 ENCOUNTER — Encounter: Payer: Self-pay | Admitting: Nurse Practitioner

## 2017-02-19 ENCOUNTER — Other Ambulatory Visit (INDEPENDENT_AMBULATORY_CARE_PROVIDER_SITE_OTHER): Payer: 59

## 2017-02-19 VITALS — BP 134/86 | HR 69 | Temp 98.7°F | Ht 65.5 in | Wt 201.0 lb

## 2017-02-19 DIAGNOSIS — E782 Mixed hyperlipidemia: Secondary | ICD-10-CM

## 2017-02-19 DIAGNOSIS — R5383 Other fatigue: Secondary | ICD-10-CM | POA: Diagnosis not present

## 2017-02-19 DIAGNOSIS — M791 Myalgia, unspecified site: Secondary | ICD-10-CM

## 2017-02-19 DIAGNOSIS — M2550 Pain in unspecified joint: Secondary | ICD-10-CM

## 2017-02-19 DIAGNOSIS — E559 Vitamin D deficiency, unspecified: Secondary | ICD-10-CM

## 2017-02-19 LAB — VITAMIN D 25 HYDROXY (VIT D DEFICIENCY, FRACTURES): VITD: 24.53 ng/mL — AB (ref 30.00–100.00)

## 2017-02-19 LAB — LIPID PANEL
CHOL/HDL RATIO: 4
Cholesterol: 221 mg/dL — ABNORMAL HIGH (ref 0–200)
HDL: 53.2 mg/dL (ref >39.00)
LDL Cholesterol: 149 mg/dL — ABNORMAL HIGH (ref 0–99)
NonHDL: 167.57
TRIGLYCERIDES: 93 mg/dL (ref 0.0–149.0)
VLDL: 18.6 mg/dL (ref 0.0–40.0)

## 2017-02-19 LAB — MAGNESIUM: Magnesium: 1.8 mg/dL (ref 1.5–2.5)

## 2017-02-19 LAB — CK: Total CK: 193 U/L — ABNORMAL HIGH (ref 7–177)

## 2017-02-19 LAB — SEDIMENTATION RATE: SED RATE: 21 mm/h — AB (ref 0–20)

## 2017-02-19 MED ORDER — VITAMIN D 50 MCG (2000 UT) PO TABS: 2000.0000 [IU] | ORAL_TABLET | Freq: Every day | ORAL | Status: DC

## 2017-02-19 NOTE — Progress Notes (Signed)
Subjective:  Patient ID: Jordan [name], female    DOB: December 11, 1975  Age: 41 y.o. MRN: [id number]  CC: Joint Swelling (hip,knee,ankle pain--burning sensation and swelling--going for a while--getting bad---pt has been exercising/ request blood worki?)   Muscle Pain  This is a chronic problem. The current episode started more than 1 year ago. The problem occurs daily. The problem has been gradually worsening since onset. The context of the pain is unknown. Pain location: diffuse, but worse in hips and knees. The pain is severe. The symptoms are aggravated by exercise. Associated symptoms include fatigue and stiffness. Pertinent negatives include no abdominal pain, chest pain, constipation, diarrhea, fever, headaches, joint swelling, nausea, rash, sensory change, swollen glands or weakness. Past treatments include acetaminophen, heat pack, cold pack, OTC NSAID and rest. The treatment provided mild relief. Swelling is present on the feet and hands. She has been behaving normally. There is no history of chronic back pain, rheumatic disease or sickle cell disease.   Joint pain: Worsening with increase exercise. Has lost weight but feel bad. Joint stiffness in morning, takes 1hr to get moving. No improvement in energy level despite healthy choices. Mother with RA. Diagnosed in late 45s.  Outpatient Medications Prior to Visit  Medication Sig Dispense Refill  . cetirizine (ZYRTEC) 10 MG tablet Take 10 mg by mouth daily.    Marland Kitchen SYNTHROID 175 MCG tablet Take 1 tablet (175 mcg total) by mouth daily before breakfast. 90 tablet 3   No facility-administered medications prior to visit.     ROS See HPI  Objective:  BP 134/86   Pulse 69   Temp 98.7 F (37.1 C)   Ht 5' 5.5" (1.664 m)   Wt 201 lb (91.2 kg)   LMP 02/07/2017   SpO2 99%   BMI 32.94 kg/m   BP Readings from Last 3 Encounters:  02/19/17 134/86  02/09/17 132/82  12/16/16 138/84    Wt Readings from Last 3 Encounters:  02/19/17  201 lb (91.2 kg)  02/09/17 199 lb (90.3 kg)  12/16/16 208 lb (94.3 kg)    Physical Exam  Constitutional: She is oriented to person, place, and time. No distress.  Neck: No thyromegaly present.  Cardiovascular: Normal rate, regular rhythm and normal heart sounds.   Pulmonary/Chest: Effort normal and breath sounds normal.  Musculoskeletal: She exhibits no edema, tenderness or deformity.  Lymphadenopathy:    She has no cervical adenopathy.  Neurological: She is alert and oriented to person, place, and time.  Skin: Skin is warm and dry. No rash noted. No erythema.  Psychiatric: She has a normal mood and affect. Her behavior is normal. Thought content normal.  Vitals reviewed.   Lab Results  Component Value Date   WBC 7.4 04/08/2016   HGB 12.3 04/08/2016   HCT 36.9 04/08/2016   PLT 265.0 04/08/2016   GLUCOSE 76 04/08/2016   CHOL 221 (H) 02/19/2017   TRIG 93.0 02/19/2017   HDL 53.20 02/19/2017   LDLCALC 149 (H) 02/19/2017   ALT 15 04/08/2016   AST 20 04/08/2016   NA 136 04/08/2016   K 3.7 04/08/2016   CL 102 04/08/2016   CREATININE 0.86 04/08/2016   BUN 14 04/08/2016   CO2 26 04/08/2016   TSH 2.54 02/09/2017    Mm Screening Breast Tomo Bilateral  Result Date: 12/17/2016 CLINICAL DATA:  Screening. EXAM: 2D DIGITAL SCREENING BILATERAL MAMMOGRAM WITH CAD AND ADJUNCT TOMO COMPARISON:  Previous exam(s). ACR Breast Density Category b: There are scattered areas of fibroglandular  density. FINDINGS: There are no findings suspicious for malignancy. Images were processed with CAD. IMPRESSION: No mammographic evidence of malignancy. A result letter of this screening mammogram will be mailed directly to the patient. RECOMMENDATION: Screening mammogram in one year. (Code:SM-B-01Y) BI-RADS CATEGORY  1: Negative. Electronically Signed   By: Susan  Turner M.D.   On: 12/17/2016 07:44    Assessment & Plan:   Nesa was seen today for joint swelling.  Diagnoses and all orders for this  visit:  Myalgia -     Antinuclear Antib (ANA); Future -     Rheumatoid factor; Future -     CK; Future -     Sed Rate (ESR); Future -     Magnesium; Future  Arthralgia, unspecified joint -     Antinuclear Antib (ANA); Future -     Rheumatoid factor; Future -     CK; Future -     Sed Rate (ESR); Future -     Magnesium; Future  Fatigue, unspecified type -     Antinuclear Antib (ANA); Future -     Rheumatoid factor; Future -     CK; Future -     Sed Rate (ESR); Future -     Magnesium; Future  Vitamin D insufficiency -     VITAMIN D 25 Hydroxy (Vit-D Deficiency, Fractures); Future -     Cholecalciferol (VITAMIN D) 2000 units tablet; Take 1 tablet (2,000 Units total) by mouth daily.  Mixed hyperlipidemia -     Lipid panel; Future   I am having Ms. Deveney start on Vitamin D. I am also having her maintain her cetirizine and SYNTHROID.  Meds ordered this encounter  Medications  . Cholecalciferol (VITAMIN D) 2000 units tablet    Sig: Take 1 tablet (2,000 Units total) by mouth daily.    Order Specific Question:   Supervising Provider    Answer:   PLOTNIKOV, ALEKSEI V [[id number]    Follow-up: Return in about 6 months (around 08/19/2017) for CPE (fasting) at [facility].  Charlotte Nche, NP 

## 2017-02-19 NOTE — Patient Instructions (Addendum)
Consider referral to rheumatology (in abnormal labs)and/or PT (if normal labs).  Mild elevation in muscle enzymes and sed rate. This could be as result of inadequate oral hydration. Mild decrease in vitamin D. Start 2000IU OTC once a day. Normal magnesium. Pending RA factor and ANA.

## 2017-02-23 LAB — ANA: ANA: NEGATIVE

## 2017-02-24 LAB — RHEUMATOID FACTOR: Rheumatoid fact SerPl-aCnc: <14 IU/mL (ref <14)

## 2017-05-10 ENCOUNTER — Encounter (INDEPENDENT_AMBULATORY_CARE_PROVIDER_SITE_OTHER): Payer: Self-pay

## 2017-05-10 ENCOUNTER — Encounter: Payer: Self-pay | Admitting: Nurse Practitioner

## 2017-05-10 ENCOUNTER — Ambulatory Visit: Payer: 59 | Admitting: Nurse Practitioner

## 2017-05-10 VITALS — BP 128/78 | HR 79 | Temp 98.6°F | Ht 65.5 in | Wt 193.5 lb

## 2017-05-10 DIAGNOSIS — J209 Acute bronchitis, unspecified: Secondary | ICD-10-CM

## 2017-05-10 DIAGNOSIS — J0140 Acute pansinusitis, unspecified: Secondary | ICD-10-CM

## 2017-05-10 MED ORDER — CEFUROXIME AXETIL 500 MG PO TABS: 500.0000 mg | ORAL_TABLET | Freq: Two times a day (BID) | ORAL | 0 refills | Status: DC

## 2017-05-10 MED ORDER — PREDNISONE 20 MG PO TABS: 40.0000 mg | ORAL_TABLET | Freq: Every day | ORAL | 0 refills | Status: DC

## 2017-05-10 MED ORDER — SALINE SPRAY 0.65 % NA SOLN
1.0000 | NASAL | 0 refills | Status: DC | PRN
Start: 2017-05-10 — End: 2017-09-08

## 2017-05-10 MED ORDER — HYDROCODONE-HOMATROPINE 5-1.5 MG/5ML PO SYRP: 5.0000 mL | ORAL_SOLUTION | Freq: Two times a day (BID) | ORAL | 0 refills | Status: DC | PRN

## 2017-05-10 MED ORDER — OXYMETAZOLINE HCL 0.05 % NA SOLN: 1.0000 | Freq: Two times a day (BID) | NASAL | 0 refills | Status: DC

## 2017-05-10 MED ORDER — DM-GUAIFENESIN ER 30-600 MG PO TB12: 1.0000 | ORAL_TABLET | Freq: Two times a day (BID) | ORAL | 0 refills | Status: DC | PRN

## 2017-05-10 NOTE — Patient Instructions (Signed)
URI Instructions: Use afrin nasal spray for 3days only  Encourage adequate oral hydration.  Take medications with food.  Return to office if no improvement in 2weeks.

## 2017-05-10 NOTE — Progress Notes (Signed)
Subjective:  Patient ID: Jordan [name], female    DOB: 1975/07/06  Age: 41 y.o. MRN: [id number]  CC: Cough   Sinusitis  This is a new problem. The current episode started 1 to 4 weeks ago. The problem has been waxing and waning since onset. There has been no fever. Associated symptoms include congestion, coughing, ear pain, headaches, a hoarse voice, sinus pressure and a sore throat. Pertinent negatives include no chills, shortness of breath, sneezing or swollen glands. Past treatments include oral decongestants and acetaminophen. The treatment provided mild relief.    Outpatient Medications Prior to Visit  Medication Sig Dispense Refill  . cetirizine (ZYRTEC) 10 MG tablet Take 10 mg by mouth daily.    . Cholecalciferol (VITAMIN D) 2000 units tablet Take 1 tablet (2,000 Units total) by mouth daily.    Marland Kitchen SYNTHROID 175 MCG tablet Take 1 tablet (175 mcg total) by mouth daily before breakfast. 90 tablet 3   No facility-administered medications prior to visit.     ROS See HPI  Objective:  BP 128/78   Pulse 79   Temp 98.6 F (37 C) (Oral)   Ht 5' 5.5" (1.664 m)   Wt 193 lb 8 oz (87.8 kg)   SpO2 97%   BMI 31.71 kg/m   BP Readings from Last 3 Encounters:  05/10/17 128/78  02/19/17 134/86  02/09/17 132/82    Wt Readings from Last 3 Encounters:  05/10/17 193 lb 8 oz (87.8 kg)  02/19/17 201 lb (91.2 kg)  02/09/17 199 lb (90.3 kg)    Physical Exam  Constitutional: She is oriented to person, place, and time. No distress.  HENT:  Right Ear: Tympanic membrane, external ear and ear canal normal.  Left Ear: Tympanic membrane, external ear and ear canal normal.  Nose: Mucosal edema and rhinorrhea present. Right sinus exhibits maxillary sinus tenderness. Left sinus exhibits maxillary sinus tenderness.  Mouth/Throat: Uvula is midline. No trismus in the jaw. Posterior oropharyngeal erythema present. No oropharyngeal exudate.  Eyes: Conjunctivae and EOM are normal. Pupils are  equal, round, and reactive to light.  Neck: Normal range of motion. Neck supple.  Cardiovascular: Normal rate.  Pulmonary/Chest: Effort normal and breath sounds normal.  Lymphadenopathy:    She has no cervical adenopathy.  Neurological: She is alert and oriented to person, place, and time.  Skin: Skin is warm and dry. No rash noted.  Psychiatric: She has a normal mood and affect. Jordan [name] behavior is normal.  Vitals reviewed.   Lab Results  Component Value Date   WBC 7.4 04/08/2016   HGB 12.3 04/08/2016   HCT 36.9 04/08/2016   PLT 265.0 04/08/2016   GLUCOSE 76 04/08/2016   CHOL 221 (H) 02/19/2017   TRIG 93.0 02/19/2017   HDL 53.20 02/19/2017   LDLCALC 149 (H) 02/19/2017   ALT 15 04/08/2016   AST 20 04/08/2016   NA 136 04/08/2016   K 3.7 04/08/2016   CL 102 04/08/2016   CREATININE 0.86 04/08/2016   BUN 14 04/08/2016   CO2 26 04/08/2016   TSH 2.54 02/09/2017    Mm Screening Breast Tomo Bilateral  Result Date: 12/17/2016 CLINICAL DATA:  Screening. EXAM: 2D DIGITAL SCREENING BILATERAL MAMMOGRAM WITH CAD AND ADJUNCT TOMO COMPARISON:  Previous exam(s). ACR Breast Density Category b: There are scattered areas of fibroglandular density. FINDINGS: There are no findings suspicious for malignancy. Images were processed with CAD. IMPRESSION: No mammographic evidence of malignancy. A result letter of this screening mammogram will be mailed directly  to the patient. RECOMMENDATION: Screening mammogram in one year. (Code:SM-B-01Y) BI-RADS CATEGORY  1: Negative. Electronically Signed   By: Curlene Dolphin M.D.   On: 12/17/2016 07:44    Assessment & Plan:   Jordan [name] was seen today for cough.  Diagnoses and all orders for this visit:  Acute non-recurrent pansinusitis -     predniSONE (DELTASONE) 20 MG tablet; Take 2 tablets (40 mg total) daily with breakfast by mouth. -     HYDROcodone-homatropine (HYCODAN) 5-1.5 MG/5ML syrup; Take 5 mLs every 12 (twelve) hours as needed by mouth for cough. -      cefUROXime (CEFTIN) 500 MG tablet; Take 1 tablet (500 mg total) 2 (two) times daily with a meal by mouth. -     oxymetazoline (AFRIN NASAL SPRAY) 0.05 % nasal spray; Place 1 spray 2 (two) times daily into both nostrils. Use only for 3days, then stop -     sodium chloride (OCEAN) 0.65 % SOLN nasal spray; Place 1 spray as needed into both nostrils for congestion. -     dextromethorphan-guaiFENesin (MUCINEX DM) 30-600 MG 12hr tablet; Take 1 tablet 2 (two) times daily as needed by mouth for cough.  Acute bronchitis, unspecified organism -     predniSONE (DELTASONE) 20 MG tablet; Take 2 tablets (40 mg total) daily with breakfast by mouth. -     HYDROcodone-homatropine (HYCODAN) 5-1.5 MG/5ML syrup; Take 5 mLs every 12 (twelve) hours as needed by mouth for cough. -     cefUROXime (CEFTIN) 500 MG tablet; Take 1 tablet (500 mg total) 2 (two) times daily with a meal by mouth. -     oxymetazoline (AFRIN NASAL SPRAY) 0.05 % nasal spray; Place 1 spray 2 (two) times daily into both nostrils. Use only for 3days, then stop -     sodium chloride (OCEAN) 0.65 % SOLN nasal spray; Place 1 spray as needed into both nostrils for congestion. -     dextromethorphan-guaiFENesin (MUCINEX DM) 30-600 MG 12hr tablet; Take 1 tablet 2 (two) times daily as needed by mouth for cough.   I am having Jordan [name] "Jordan [name]" start on predniSONE, HYDROcodone-homatropine, cefUROXime, oxymetazoline, sodium chloride, and dextromethorphan-guaiFENesin. I am also having Jordan [name] maintain Jordan [name] cetirizine, SYNTHROID, and Vitamin D.  Meds ordered this encounter  Medications  . predniSONE (DELTASONE) 20 MG tablet    Sig: Take 2 tablets (40 mg total) daily with breakfast by mouth.    Dispense:  6 tablet    Refill:  0    Order Specific Question:   Supervising Provider    Answer:   Lucille Passy [[id number]]  . HYDROcodone-homatropine (HYCODAN) 5-1.5 MG/5ML syrup    Sig: Take 5 mLs every 12 (twelve) hours as needed by mouth for cough.    Dispense:   40 mL    Refill:  0    Order Specific Question:   Supervising Provider    Answer:   Lucille Passy [[id number]]  . cefUROXime (CEFTIN) 500 MG tablet    Sig: Take 1 tablet (500 mg total) 2 (two) times daily with a meal by mouth.    Dispense:  14 tablet    Refill:  0    Order Specific Question:   Supervising Provider    Answer:   Lucille Passy [[id number]]  . oxymetazoline (AFRIN NASAL SPRAY) 0.05 % nasal spray    Sig: Place 1 spray 2 (two) times daily into both nostrils. Use only for 3days, then stop    Dispense:  30 mL  Refill:  0    Order Specific Question:   Supervising Provider    Answer:   Lucille Passy [[id number]]  . sodium chloride (OCEAN) 0.65 % SOLN nasal spray    Sig: Place 1 spray as needed into both nostrils for congestion.    Dispense:  15 mL    Refill:  0    Order Specific Question:   Supervising Provider    Answer:   Lucille Passy [[id number]]  . dextromethorphan-guaiFENesin (MUCINEX DM) 30-600 MG 12hr tablet    Sig: Take 1 tablet 2 (two) times daily as needed by mouth for cough.    Dispense:  14 tablet    Refill:  0    Order Specific Question:   Supervising Provider    Answer:   Lucille Passy [[id number]]    Follow-up: Return if symptoms worsen or fail to improve.  Wilfred Lacy, NP

## 2017-05-16 ENCOUNTER — Encounter (HOSPITAL_COMMUNITY): Payer: Self-pay

## 2017-05-16 ENCOUNTER — Emergency Department (HOSPITAL_COMMUNITY)
Admission: EM | Admit: 2017-05-16 | Discharge: 2017-05-16 | Disposition: A | Discharge status: Home / Self Care | Payer: 59 | Attending: Emergency Medicine | Admitting: Emergency Medicine

## 2017-05-16 ENCOUNTER — Emergency Department (HOSPITAL_COMMUNITY): Payer: 59

## 2017-05-16 DIAGNOSIS — Z79899 Other long term (current) drug therapy: Secondary | ICD-10-CM | POA: Diagnosis not present

## 2017-05-16 DIAGNOSIS — J9801 Acute bronchospasm: Secondary | ICD-10-CM | POA: Insufficient documentation

## 2017-05-16 DIAGNOSIS — E039 Hypothyroidism, unspecified: Secondary | ICD-10-CM | POA: Insufficient documentation

## 2017-05-16 DIAGNOSIS — R05 Cough: Secondary | ICD-10-CM | POA: Diagnosis present

## 2017-05-16 DIAGNOSIS — J209 Acute bronchitis, unspecified: Secondary | ICD-10-CM

## 2017-05-16 MED ORDER — PREDNISONE 10 MG PO TABS: 40.0000 mg | ORAL_TABLET | Freq: Every day | ORAL | 0 refills | Status: DC

## 2017-05-16 MED ORDER — IPRATROPIUM-ALBUTEROL 0.5-2.5 (3) MG/3ML IN SOLN
3.0000 mL | RESPIRATORY_TRACT | Status: DC
Start: 2017-05-16 — End: 2017-05-16
  Administered 2017-05-16: 3 mL via RESPIRATORY_TRACT
  Filled 2017-05-16: qty 3

## 2017-05-16 MED ORDER — IPRATROPIUM-ALBUTEROL 0.5-2.5 (3) MG/3ML IN SOLN
3.0000 mL | Freq: Once | RESPIRATORY_TRACT | Status: AC
  Administered 2017-05-16: 3 mL via RESPIRATORY_TRACT
  Filled 2017-05-16: qty 3

## 2017-05-16 MED ORDER — BENZONATATE 100 MG PO CAPS: 100.0000 mg | ORAL_CAPSULE | Freq: Three times a day (TID) | ORAL | 0 refills | Status: DC | PRN

## 2017-05-16 MED ORDER — ALBUTEROL (5 MG/ML) CONTINUOUS INHALATION SOLN
INHALATION_SOLUTION | RESPIRATORY_TRACT | Status: AC
  Filled 2017-05-16: qty 2

## 2017-05-16 MED ORDER — PREDNISONE 20 MG PO TABS
60.0000 mg | ORAL_TABLET | Freq: Once | ORAL | Status: AC
  Administered 2017-05-16: 60 mg via ORAL
  Filled 2017-05-16: qty 3

## 2017-05-16 MED ORDER — ALBUTEROL SULFATE HFA 108 (90 BASE) MCG/ACT IN AERS: 1.0000 | INHALATION_SPRAY | Freq: Four times a day (QID) | RESPIRATORY_TRACT | 0 refills | Status: DC | PRN

## 2017-05-16 MED ORDER — ALBUTEROL (5 MG/ML) CONTINUOUS INHALATION SOLN
10.0000 mg/h | INHALATION_SOLUTION | RESPIRATORY_TRACT | Status: DC
  Administered 2017-05-16: 10 mg/h via RESPIRATORY_TRACT

## 2017-05-16 NOTE — ED Provider Notes (Signed)
[facility] EMERGENCY DEPARTMENT Provider Note   CSN: [id number] Arrival date & time: 05/16/17  8416     History   Chief Complaint No chief complaint on file.   HPI Jordan [name] is a 41 y.o. female presents to ED for persistent cough x one week associated with "bubble" sounds with breathing, chills, body aches. Was treated for bronchitis by PCP one week ago with antibiotic, steroids, hycodan and mucinex. States other symptoms are about the same but cough has gotten worse.  Unable to sleep due to persistent cough. No associated fevers, chest pain, SOB, nausea, vomiting, abdominal pain, sore throat. No h/o lung disease. No tobacco use. Has had wheezing in the past with previous bronchitis.   HPI  Past Medical History:  Diagnosis Date  . Complication of anesthesia 2009   failed epidural, severe pain from second attempt  . HSV-2 infection   . Hypothyroid    graves    Patient Active Problem List   Diagnosis Date Noted  . Fatigue 04/08/2016  . Hip pain, acute, right 04/08/2016  . Vitamin D deficiency 04/08/2016  . Hyperlipidemia 04/08/2016  . 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 CESAREAN SECTION    . DILATION AND CURETTAGE OF UTERUS  1995   TAB  . HYSTEROSCOPY  03/21/2012   Procedure: HYSTEROSCOPY;  Surgeon: Marvene Staff, MD;  Location: [facility];  Service: Gynecology;;  . IUD REMOVAL  03/21/2012   Procedure: INTRAUTERINE DEVICE (IUD) REMOVAL;  Surgeon: Marvene Staff, MD;  Location: [facility];  Service: Gynecology;  Laterality: N/A;  . LAPAROSCOPIC TUBAL LIGATION  03/21/2012   Procedure: LAPAROSCOPIC TUBAL LIGATION;  Surgeon: Marvene Staff, MD;  Location: [facility];  Service: Gynecology;  Laterality: Bilateral;  with cautery  . SUPERFICIAL LYMPH NODE BIOPSY / EXCISION    . TUBAL LIGATION    . UTERINE SUSPENSION  2007    OB History    Gravida Para Term Preterm AB Living   3 2 2    1 2    SAB TAB Ectopic Multiple Live Births     1     2       Home Medications    Prior to Admission medications   Medication Sig Start Date End Date Taking? Authorizing Provider  albuterol (PROVENTIL HFA;VENTOLIN HFA) 108 (90 Base) MCG/ACT inhaler Inhale 1-2 puffs into the lungs every 6 (six) hours as needed for wheezing or shortness of breath. 05/16/17   Kinnie Feil, PA-C  benzonatate (TESSALON PERLES) 100 MG capsule Take 1 capsule (100 mg total) by mouth 3 (three) times daily as needed for cough. FOR DISRUPTIVE DAY TIME COUGH 05/16/17   Kinnie Feil, PA-C  cefUROXime (CEFTIN) 500 MG tablet Take 1 tablet (500 mg total) 2 (two) times daily with a meal by mouth. 05/10/17   Nche, Charlene Brooke, NP  cetirizine (ZYRTEC) 10 MG tablet Take 10 mg by mouth daily.    [provider]  Cholecalciferol (VITAMIN D) 2000 units tablet Take 1 tablet (2,000 Units total) by mouth daily. 02/19/17   Nche, Charlene Brooke, NP  dextromethorphan-guaiFENesin (MUCINEX DM) 30-600 MG 12hr tablet Take 1 tablet 2 (two) times daily as needed by mouth for cough. 05/10/17   Nche, Charlene Brooke, NP  HYDROcodone-homatropine (HYCODAN) 5-1.5 MG/5ML syrup Take 5 mLs every 12 (twelve) hours as needed by mouth for cough. 05/10/17   Nche, Charlene Brooke, NP  oxymetazoline (AFRIN NASAL SPRAY) 0.05 %  nasal spray Place 1 spray 2 (two) times daily into both nostrils. Use only for 3days, then stop 05/10/17   Nche, Charlene Brooke, NP  predniSONE (DELTASONE) 10 MG tablet Take 4 tablets (40 mg total) by mouth daily for 3 days. 05/16/17 05/19/17  Kinnie Feil, PA-C  predniSONE (DELTASONE) 20 MG tablet Take 2 tablets (40 mg total) daily with breakfast by mouth. 05/10/17   Nche, Charlene Brooke, NP  sodium chloride (OCEAN) 0.65 % SOLN nasal spray Place 1 spray as needed into both nostrils for congestion. 05/10/17   NcheCharlene Brooke, NP  SYNTHROID 175 MCG tablet Take 1 tablet (175 mcg total) by mouth daily before  breakfast. 02/10/17   Philemon Kingdom, MD    Family History Family History  Problem Relation Age of Onset  . Hypertension Mother   . Hypertension Father   . Breast cancer Paternal Aunt   . Thyroid disease Paternal Aunt   . Breast cancer Maternal Aunt   . Thyroid disease Brother   . Stroke Maternal Grandmother   . Stroke Paternal Grandmother   . Thyroid disease Paternal Grandmother   . Thyroid disease Paternal Aunt     Social History Social History   Tobacco Use  . Smoking status: Never Smoker  . Smokeless tobacco: Never Used  Substance Use Topics  . Alcohol use: No    Alcohol/week: 0.0 oz  . Drug use: No     Allergies   Patient has no known allergies.   Review of Systems Review of Systems  Constitutional: Positive for chills.  Respiratory: Positive for cough and wheezing.   Musculoskeletal: Positive for myalgias.  All other systems reviewed and are negative.    Physical Exam Updated Vital Signs BP (!) 153/87 (BP Location: Right Arm)   Pulse 93   Temp 99.3 F (37.4 C) (Oral)   Resp 20   Wt 87.1 kg (192 lb)   SpO2 96%   BMI 31.46 kg/m   Physical Exam  Constitutional: She is oriented to person, place, and time. She appears well-developed and well-nourished. No distress.  NAD.  HENT:  Head: Normocephalic and atraumatic.  Right Ear: External ear normal.  Left Ear: External ear normal.  Nose: Nose normal.  Mouth/Throat: Oropharynx is clear and moist. No oropharyngeal exudate.  Eyes: Conjunctivae and EOM are normal. Pupils are equal, round, and reactive to light. No scleral icterus.  Neck: Normal range of motion. Neck supple.  Cardiovascular: Normal rate, regular rhythm and normal heart sounds.  No murmur heard. Pulmonary/Chest: Effort normal. She has wheezes.  Faint end expiratory wheezing in lower lobes bilaterally No tachypnea or hypoxia  Normal WOB  Musculoskeletal: Normal range of motion. She exhibits no deformity.  Lymphadenopathy:    She has  no cervical adenopathy.  Neurological: She is alert and oriented to person, place, and time.  Skin: Skin is warm and dry. Capillary refill takes less than 2 seconds.  Psychiatric: She has a normal mood and affect. Her behavior is normal. Judgment and thought content normal.  Nursing note and vitals reviewed.    ED Treatments / Results  Labs (all labs ordered are listed, but only abnormal results are displayed) Labs Reviewed - No data to display  EKG  EKG Interpretation None       Radiology Dg Chest 2 View  Result Date: 05/16/2017 CLINICAL DATA:  41 year old female with a history of cough and congestion EXAM: CHEST  2 VIEW COMPARISON:  None. FINDINGS: Cardiomediastinal silhouette within normal limits. No evidence  of central vascular congestion. No interlobular septal thickening. No pneumothorax or pleural effusion. No confluent airspace disease. No displaced fracture. IMPRESSION: Negative for acute cardiopulmonary disease Electronically Signed   By: Corrie Mckusick D.O.   On: 05/16/2017 09:00    Procedures Procedures (including critical care time)  Medications Ordered in ED Medications  albuterol (PROVENTIL,VENTOLIN) solution continuous neb ( Nebulization Not Given 05/16/17 1221)  predniSONE (DELTASONE) tablet 60 mg (60 mg Oral Given 05/16/17 0926)  ipratropium-albuterol (DUONEB) 0.5-2.5 (3) MG/3ML nebulizer solution 3 mL (3 mLs Nebulization Given 05/16/17 0926)     Initial Impression / Assessment and Plan / ED Course  I have reviewed the triage vital signs and the nursing notes.  Pertinent labs & imaging results that were available during my care of the patient were reviewed by me and considered in my medical decision making (see chart for details).  Clinical Course as of May 16 1252  Nancy Fetter May 16, 2017  1010 Reassessed patient. Wheezing has not improved after 1 duoneb treatment. Pt reports persistent wheezing and chest congestion, does not feel better. Will repeat duoneb,  continous and reassess.   [CG]    Clinical Course User Index [CG] Kinnie Feil, PA-C   41 yo female recently diagnosed with bronchitis on antibiotics presents for persistent cough and wheezing. No fevers, chest pain or shortness of breath. Thinks cough is getting worse.   On initial exam she has faint end expiratory wheezing and persistent cough. No fever, tachypnea, tachycardia or hypoxia. Normal WOB.   CXR reassuring. Lung sounds, cough and symptoms improved after breathing tx.   Suspect post viral cough syndrome vs bronchospasm. She will be d/c with albuterol, prednisone x 3 days, tessalon perles and re-eval by PCP in 2-3 days. Discussed return precautions.   Final Clinical Impressions(s) / ED Diagnoses   Final diagnoses:  Bronchitis with bronchospasm    ED Discharge Orders        Ordered    benzonatate (TESSALON PERLES) 100 MG capsule  3 times daily PRN     05/16/17 1220    albuterol (PROVENTIL HFA;VENTOLIN HFA) 108 (90 Base) MCG/ACT inhaler  Every 6 hours PRN     05/16/17 1220    predniSONE (DELTASONE) 10 MG tablet  Daily     05/16/17 1220       Kinnie Feil, [place] 05/16/17 1254    Virgel Manifold, MD 05/16/17 1531

## 2017-05-16 NOTE — Discharge Instructions (Signed)
Your chest x-ray was normal.   Please use albuterol inhaler, prednisone and tessalon perles as prescribed. Continue taking all medications as prescribed.   Follow up with your primary care doctor if symptoms do not improve or worsen in 2-3 days.

## 2017-05-16 NOTE — ED Notes (Signed)
resp into do cont neb

## 2017-05-16 NOTE — ED Notes (Signed)
ED Provider at bedside. 

## 2017-05-16 NOTE — ED Triage Notes (Signed)
Patient complains of cough and congestion for greater than 1 week. Taking antibiotic and cough meds with minimal relief, NAD. Alert and oriented

## 2017-05-19 ENCOUNTER — Encounter: Payer: Self-pay | Admitting: Internal Medicine

## 2017-05-19 ENCOUNTER — Ambulatory Visit (INDEPENDENT_AMBULATORY_CARE_PROVIDER_SITE_OTHER): Payer: 59 | Admitting: Internal Medicine

## 2017-05-19 VITALS — BP 142/86 | HR 77 | Ht 65.0 in | Wt 196.0 lb

## 2017-05-19 DIAGNOSIS — J45991 Cough variant asthma: Secondary | ICD-10-CM | POA: Diagnosis not present

## 2017-05-19 MED ORDER — BUDESONIDE-FORMOTEROL FUMARATE 80-4.5 MCG/ACT IN AERO: 2.0000 | INHALATION_SPRAY | Freq: Two times a day (BID) | RESPIRATORY_TRACT | 0 refills | Status: DC

## 2017-05-19 MED ORDER — PREDNISONE 10 MG PO TABS: ORAL_TABLET | ORAL | 0 refills | Status: DC

## 2017-05-19 MED ORDER — BUDESONIDE-FORMOTEROL FUMARATE 80-4.5 MCG/ACT IN AERO: 2.0000 | INHALATION_SPRAY | Freq: Two times a day (BID) | RESPIRATORY_TRACT | 11 refills | Status: DC

## 2017-05-19 MED ORDER — BENZONATATE 200 MG PO CAPS: 200.0000 mg | ORAL_CAPSULE | Freq: Three times a day (TID) | ORAL | 1 refills | Status: DC | PRN

## 2017-05-19 NOTE — Patient Instructions (Addendum)
Plan A = Automatic = symbicort 80 Take 2 puffs first thing in am and then another 2 puffs about 12 hours later.   Work on inhaler technique:  relax and gently blow all the way out then take a nice smooth deep breath back in, triggering the inhaler at same time you start breathing in.  Hold for up to 5 seconds if you can. Blow out thru nose. Rinse and gargle with water when done    Plan B = Backup for breathing  Only use your albuterol(PROAIR) as a rescue medication to be used if you can't catch your breath by resting or doing a relaxed purse lip breathing pattern.  - The less you use it, the better it will work when you need it. - Ok to use the inhaler up to 2 puffs  every 4 hours if you must but call for appointment if use goes up over your usual need - Don't leave home without it !!  (think of it like the spare tire for your car)    Plan B = back up cough up  Tessilon 200 mg up to every 6 hours as needed   Plan B = for Drainage/ throat drainage For drainage / throat tickle try take CHLORPHENIRAMINE  4 mg - take one every 4 hours as needed - available over the counter- may cause drowsiness so start with just a bedtime dose or two and see how you tolerate it before trying in daytime    Prednisone 10 mg take  4 each am x 2 days,   2 each am x 2 days,  1 each am x 2 days and stop (this is to eliminate allergies and inflammation from coughing)    Try Prilosec otc 20mg   Take 30-60 min before first meal of the day and Pepcid ac (famotidine) 20 mg one @  bedtime until cough is completely gone for at least a week without the need for cough suppression      GERD (REFLUX)  is an extremely common cause of respiratory symptoms, many times with no significant heartburn at all.    It can be treated with medication, but also with lifestyle changes including avoidance of late meals, excessive alcohol, smoking cessation, and avoid fatty foods, chocolate, peppermint, colas, red wine, and acidic juices such  as orange juice.  NO MINT OR MENTHOL PRODUCTS SO NO COUGH DROPS   USE HARD CANDY INSTEAD (jolley ranchers or Stover's or Lifesavers (all available in sugarless versions) NO OIL BASED VITAMINS - use powdered substitutes.   Please schedule a follow up office visit in 2 weeks, sooner if needed

## 2017-05-19 NOTE — Assessment & Plan Note (Addendum)
05/19/2017  After extensive coaching HFA effectiveness =    75%  > try symbicort 80 2bid   Partially steroid dep flare in setting of seasonal rhinitis/ "bronchitis" support dx of asthma but her exam and harshness of cough are also typical of Upper airway cough syndrome (previously labeled PNDS),  is so named because it's frequently impossible to sort out how much is  CR/sinusitis with freq throat clearing (which can be related to primary GERD)   vs  causing  secondary (" extra esophageal")  GERD from wide swings in gastric pressure that occur with throat clearing, often  promoting self use of mint and menthol lozenges that reduce the lower esophageal sphincter tone and exacerbate the problem further in a cyclical fashion.   These are the same pts (now being labeled as having "irritable larynx syndrome" by some cough centers) who not infrequently have a history of having failed to tolerate ace inhibitors,  dry powder inhalers or biphosphonates or report having atypical/extraesophageal reflux symptoms that don't respond to standard doses of PPI  and are easily confused as having aecopd or asthma flares by even experienced allergists/ pulmonologists (myself included).   Of the three most common causes of  Sub-acute or recurrent or chronic cough, only one (GERD)  can actually contribute to/ trigger  the other two (asthma and post nasal drip syndrome)  and perpetuate the cylce of cough.  While not intuitively obvious, many patients with chronic low grade reflux do not cough until there is a primary insult that disturbs the protective epithelial barrier and exposes sensitive nerve endings.   This is typically viral but can be direct physical injury such as with an endotracheal tube.   The point is that once this occurs, it is difficult to eliminate the cycle  using anything but a maximally effective acid suppression regimen at least in the short run, accompanied by an appropriate diet to address non acid GERD and  control / eliminate the cough itself for at least 3 days.    rx with  Prednisone 10 mg take  4 each am x 2 days,   2 each am x 2 days,  1 each am x 2 days and stop  Suppress the cough with high dose tessalon since low dose helping some and wants to avoid narcs Max short term gerd rx and diet including no cough drops Teach hfa/ low dose symb as the higher dose can aggravate cough  F/u 2 weeks consider allergy testing next    Total time devoted to counseling  > 50 % of initial 60 min office visit:  review case with pt/ discussion of options/alternatives/ personally creating written customized instructions  in presence of pt  then going over those specific  Instructions directly with the pt including how to use all of the meds but in particular covering each new medication in detail and the difference between the maintenance= "automatic" meds and the prns using an action plan format for the latter (If this problem/symptom => do that organization reading Left to right).  Please see AVS from this visit for a full list of these instructions which I personally wrote for this pt and  are unique to this visit.

## 2017-05-19 NOTE — Progress Notes (Addendum)
Subjective:     Patient ID: Jordan [name], female   DOB: 1976/03/30,     MRN: [id number]  HPI  78 yobf  Never smoker with onset in middle school with fall sneezing/ watery rhinitis    eventually rx clariton improved but over the years pattern has worsened in severity and evolved with fall > spring pattern  since late 20's but rarely needing prednisone or inhalers self referred to pulmonary p 3rd severe episode onset around mid nov 2018 but this time pred did not help / saba not sure but thinks it made the cough worse.   05/19/2017 1st [facility] office visit/ Santosh Petter   Chief Complaint  Patient presents with  . Pulmonary Consult    Self referral. Pt c/o cough for approx 2 wks. Cough is mainly non prod, but will occ cough up some clear to brown sputum. She states if she tried to lie down flat she coughs, and has been having to sleep in a recliner.  She states that she has a history of bronchitis. She states she is using her albuterol inhaler 3 x per day on average.   onset was acute this time mid Nove 2018 with nasal congestion Alphonsa Gin rhinitis/ cough/ eye watering, initial rx x 3 d pred and started cefuroxime 05/10/17 and no better > to ER   05/16/17 due to "bubbling in chest" and sob and unable to lie flat> er rx multiple nebs /  more pred completed 05/19/2017 plus tessalon 100 plus alb saba  - feeling some  better  X last 24 h but still unable to lie back in bed s severe coughing fits   No obvious day to day or daytime variability or assoc excess/ purulent sputum or mucus plugs or hemoptysis or cp or chest tightness, subjective wheeze or overt   hb symptoms. No unusual exposure hx or h/o childhood pna/ asthma or knowledge of premature birth.    Also denies any obvious fluctuation of symptoms with weather or environmental changes or other aggravating or alleviating factors except as outlined above   Current Allergies, Complete Past Medical History, Past Surgical History, Family History, and  Social History were reviewed in [employer] record.  ROS  The following are not active complaints unless bolded Hoarseness, sore throat, dysphagia, dental problems, itching, sneezing,  nasal congestion or discharge of excess mucus or purulent secretions, ear ache,   fever, chills, sweats, unintended wt loss or wt gain, classically pleuritic or exertional cp,  orthopnea pnd or leg swelling, presyncope, palpitations, abdominal pain, anorexia, nausea, vomiting, diarrhea  or change in bowel habits or change in bladder habits, change in stools or change in urine, dysuria, hematuria,  rash, arthralgias, visual complaints, headache, numbness, weakness or ataxia or problems with walking or coordination,  change in mood/affect or memory.        Current Meds  Medication Sig  . albuterol (PROVENTIL HFA;VENTOLIN HFA) 108 (90 Base) MCG/ACT inhaler Inhale 1-2 puffs into the lungs every 6 (six) hours as needed for wheezing or shortness of breath.  . cefUROXime (CEFTIN) 500 MG tablet Take 1 tablet (500 mg total) 2 (two) times daily with a meal by mouth.  . Cholecalciferol (VITAMIN D) 2000 units tablet Take 1 tablet (2,000 Units total) by mouth daily.  . sodium chloride (OCEAN) 0.65 % SOLN nasal spray Place 1 spray as needed into both nostrils for congestion.  Marland Kitchen SYNTHROID 175 MCG tablet Take 1 tablet (175 mcg total) by mouth daily before  breakfast.  . [  benzonatate (TESSALON PERLES) 100 MG capsule Take 1 capsule (100 mg total) by mouth 3 (three) times daily as needed for cough. FOR DISRUPTIVE DAY TIME COUGH        Review of Systems     Objective:   Physical Exam    amb  bf nad   Wt Readings from Last 3 Encounters:  05/19/17 196 lb (88.9 kg)  05/16/17 192 lb (87.1 kg)  05/10/17 193 lb 8 oz (87.8 kg)    Vital signs reviewed  - Note on arrival 02 sats  96% on RA      HEENT: nl dentition, turbinates bilaterally, and oropharynx. Nl external ear canals without cough  reflex   NECK :  without JVD/Nodes/TM/ nl carotid upstrokes bilaterally   LUNGS: no acc muscle use,  Nl contour chest with upper and lower airway wheezing better with plm/ panting/ and  Cough on late exp    CV:  RRR  no s3 or murmur or increase in P2, and no edema   ABD:  soft and nontender with nl inspiratory excursion in the supine position. No bruits or organomegaly appreciated, bowel sounds nl  MS:  Nl gait/ ext warm without deformities, calf tenderness, cyanosis or clubbing No obvious joint restrictions   SKIN: warm and dry without lesions    NEURO:  alert, approp, nl sensorium with  no motor or cerebellar deficits apparent.       I personally reviewed images and agree with radiology impression as follows:  CXR:   05/16/17  Negative for acute cardiopulmonary disease   . Assessment:

## 2017-05-21 ENCOUNTER — Inpatient Hospital Stay: Payer: 59 | Admitting: Nurse Practitioner

## 2017-05-21 ENCOUNTER — Telehealth: Payer: Self-pay | Admitting: Internal Medicine

## 2017-05-21 NOTE — Telephone Encounter (Signed)
Fwd FMLA forms to [employer] via interoffice mail-pr

## 2017-06-02 ENCOUNTER — Encounter: Payer: Self-pay | Admitting: Internal Medicine

## 2017-06-02 ENCOUNTER — Other Ambulatory Visit (INDEPENDENT_AMBULATORY_CARE_PROVIDER_SITE_OTHER): Payer: 59

## 2017-06-02 ENCOUNTER — Ambulatory Visit (INDEPENDENT_AMBULATORY_CARE_PROVIDER_SITE_OTHER): Payer: 59 | Admitting: Internal Medicine

## 2017-06-02 VITALS — BP 140/88 | HR 100 | Ht 65.0 in | Wt 197.2 lb

## 2017-06-02 DIAGNOSIS — J45991 Cough variant asthma: Secondary | ICD-10-CM

## 2017-06-02 LAB — CBC WITH DIFFERENTIAL/PLATELET
BASOS ABS: 0.1 10*3/uL (ref 0.0–0.1)
BASOS PCT: 0.8 % (ref 0.0–3.0)
EOS ABS: 0.1 10*3/uL (ref 0.0–0.7)
Eosinophils Relative: 1.5 % (ref 0.0–5.0)
HEMATOCRIT: 37.4 % (ref 36.0–46.0)
HEMOGLOBIN: 12.2 g/dL (ref 12.0–15.0)
LYMPHS PCT: 28.7 % (ref 12.0–46.0)
Lymphs Abs: 2.5 10*3/uL (ref 0.7–4.0)
MCHC: 32.6 g/dL (ref 30.0–36.0)
MCV: 86 fl (ref 78.0–100.0)
MONOS PCT: 7.3 % (ref 3.0–12.0)
Monocytes Absolute: 0.6 10*3/uL (ref 0.1–1.0)
NEUTROS ABS: 5.3 10*3/uL (ref 1.4–7.7)
Neutrophils Relative %: 61.7 % (ref 43.0–77.0)
PLATELETS: 276 10*3/uL (ref 150.0–400.0)
RBC: 4.35 Mil/uL (ref 3.87–5.11)
RDW: 14.2 % (ref 11.5–15.5)
WBC: 8.6 10*3/uL (ref 4.0–10.5)

## 2017-06-02 LAB — NITRIC OXIDE: Nitric Oxide: 7

## 2017-06-02 MED ORDER — PANTOPRAZOLE SODIUM 40 MG PO TBEC: 40.0000 mg | DELAYED_RELEASE_TABLET | Freq: Every day | ORAL | 2 refills | Status: DC

## 2017-06-02 NOTE — Patient Instructions (Addendum)
Take tessalon more consistently during the day to suppress urge to cough  Continue pepcid and chlorpheniramine at bedtime  Zyrtec 10 mg in am as needed for itching / sneezing / runny nose   Increase prilosec to 20 mg x 2 = protonix 40 mg Take 30-60 min before first meal of the day   Leave off symbicort for now   Please schedule a follow up office visit in 4 weeks, sooner if needed

## 2017-06-02 NOTE — Progress Notes (Signed)
Subjective:     Patient ID: Jordan [name], female   DOB: Oct 27, 1975,     MRN: [id number]    Brief patient profile:  56 yobf  Never smoker with onset in middle school with fall sneezing/ watery rhinitis eventually rx clariton improved but over the years pattern has worsened in severity and evolved with fall > spring pattern  since late 20's but rarely needing prednisone 100% or inhalers self referred to pulmonary p 3rd year in a row severe episode of severe cough onset of transient sniffles  around mid nov 2018 but this time pred did not help / saba not sure but thinks it made the cough worse so self referred 06/02/2017 .    History of Present Illness  05/19/2017 1st [facility] office visit/ Emmery Seiler   Chief Complaint  Patient presents with  . Pulmonary Consult    Self referral. Pt c/o cough for approx 2 wks. Cough is mainly non prod, but will occ cough up some clear to brown sputum. She states if she tried to lie down flat she coughs, and has been having to sleep in a recliner.  She states that she has a history of bronchitis. She states she is using her albuterol inhaler 3 x per day on average.   onset was acute this time mid Nov  2018 with nasal congestion Alphonsa Gin rhinitis/ cough/ eye watering, initial rx x 3 d pred and started cefuroxime 05/10/17 and no better > to ER   05/16/17 due to "bubbling in chest" and sob and unable to lie flat> er rx multiple nebs /  more pred completed 05/19/2017 plus tessalon 100 plus alb saba  - feeling some  better  X last 24 h but still unable to lie back in bed s severe coughing fits  rec Plan A = Automatic = symbicort 80 Take 2 puffs first thing in am and then another 2 puffs about 12 hours later.  Work on inhaler technique:  relax and gently blow all the way out then take a nice smooth deep breath back in, triggering the inhaler at same time you start breathing in.  Hold for up to 5 seconds if you can. Blow out thru nose. Rinse and gargle with water when  done Plan B = Backup for breathing  Only use your albuterol(PROAIR) as a rescue medication  Plan B = back up cough up  Tessilon 200 mg up to every 6 hours as needed  Plan B = for Drainage/ throat drainage For drainage / throat tickle try take CHLORPHENIRAMINE  4 mg - take one every 4 hours as needed - available over the counter- may cause drowsiness so start with just a bedtime dose or two and see how you tolerate it before trying in daytime   Prednisone 10 mg take  4 each am x 2 days,   2 each am x 2 days,  1 each am x 2 days and stop (this is to eliminate allergies and inflammation from coughing) Try Prilosec otc 20mg   Take 30-60 min before first meal of the day and Pepcid ac (famotidine) 20 mg one @  bedtime until cough is completely gone for at least a week without the need for cough suppression GERD diet     06/02/2017  f/u ov/Bernadene Garside 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 Follow-up    Pt states that she is still coughing and states that she had a bad coughing fit  last night, 06/01/17. Pt states that her cough is usually a dry cough but has been having some clear nasal drainage Denies any SOB or CP.  stopped the symbicort 80 p a week with no more noct cough at all since then but maint on h1 and h2 at hs since last ov  Most of her problem occur with changes in temp or laughing during the day > severe coughing fits  avg tessalon is once a day in evening seems to help   Re chlortrimeton   can't take during the day due to drownsiness but tol zyrtec ok  In tpast  Sucking on peppemints all day (advised against in capital letters last ov)  Not limited by breathing from desired activities/ no need for saba    No obvious day to day or daytime variability or assoc excess/ purulent sputum or mucus plugs or hemoptysis or cp or chest tightness, subjective wheeze or overt sinus or hb symptoms. No unusual exposure hx or h/o childhood pna/ asthma or knowledge of  premature birth.  Sleeping ok flat without nocturnal  or early am exacerbation  of respiratory  c/o's or need for noct saba. Also denies any obvious fluctuation of symptoms with weather or environmental changes or other aggravating or alleviating factors except as outlined above   Current Allergies, Complete Past Medical History, Past Surgical History, Family History, and Social History were reviewed in [employer] record.  ROS  The following are not active complaints unless bolded Hoarseness, sore throat, dysphagia, dental problems, itching, sneezing,  nasal congestion or discharge of excess mucus or purulent secretions, ear ache,   fever, chills, sweats, unintended wt loss or wt gain, classically pleuritic or exertional cp,  orthopnea pnd or leg swelling, presyncope, palpitations, abdominal pain, anorexia, nausea, vomiting, diarrhea  or change in bowel habits or change in bladder habits, change in stools or change in urine, dysuria, hematuria,  rash, arthralgias, visual complaints, headache, numbness, weakness or ataxia or problems with walking or coordination,  change in mood/affect or memory.        Current Meds  Medication Sig  . albuterol (PROVENTIL HFA;VENTOLIN HFA) 108 (90 Base) MCG/ACT inhaler Inhale 1-2 puffs into the lungs every 6 (six) hours as needed for wheezing or shortness of breath.  . benzonatate (TESSALON) 200 MG capsule Take 1 capsule (200 mg total) by mouth 3 (three) times daily as needed for cough.      .  . Cholecalciferol (VITAMIN D) 2000 units tablet Take 1 tablet (2,000 Units total) by mouth daily.  . sodium chloride (OCEAN) 0.65 % SOLN nasal spray Place 1 spray as needed into both nostrils for congestion.  Marland Kitchen SYNTHROID 175 MCG tablet Take 1 tablet (175 mcg total) by mouth daily before breakfast.  .                        Objective:   Physical Exam    amb  bf nad/ no cough at all during ov    06/02/2017     197   05/19/17 196 lb  (88.9 kg)  05/16/17 192 lb (87.1 kg)  05/10/17 193 lb 8 oz (87.8 kg)    Vital signs reviewed - Note on arrival 02 sats  98% on RA         HEENT: nl dentition, turbinates bilaterally, and oropharynx. Nl external ear canals without cough reflex   NECK :  without JVD/Nodes/TM/ nl carotid upstrokes bilaterally  LUNGS: no acc muscle use,  Nl contour chest which is clear to A and P bilaterally without cough on insp or exp maneuvers   CV:  RRR  no s3 or murmur or increase in P2, and no edema   ABD:  soft and nontender with nl inspiratory excursion in the supine position. No bruits or organomegaly appreciated, bowel sounds nl  MS:  Nl gait/ ext warm without deformities, calf tenderness, cyanosis or clubbing No obvious joint restrictions   SKIN: warm and dry without lesions    NEURO:  alert, approp, nl sensorium with  no motor or cerebellar deficits apparent.         I personally reviewed images and agree with radiology impression as follows:  CXR:   05/16/17  Negative for acute cardiopulmonary disease   Labs ordered 06/02/2017   Allergy profile   . Assessment:

## 2017-06-03 ENCOUNTER — Encounter: Payer: Self-pay | Admitting: Internal Medicine

## 2017-06-03 LAB — RESPIRATORY ALLERGY PROFILE REGION II ~~LOC~~
Allergen, A. alternata, m6: <0.1 kU/L
Allergen, Cedar tree, t12: <0.1 kU/L
Allergen, Comm Silver Birch, t9: <0.1 kU/L
Allergen, Cottonwood, t14: <0.1 kU/L
Allergen, Mulberry, t76: <0.1 kU/L
Allergen, P. notatum, m1: <0.1 kU/L
Bermuda Grass: <0.1 kU/L
CLADOSPORIUM HERBARUM (M2) IGE: <0.1 kU/L
CLASS: 0
CLASS: 0
CLASS: 0
CLASS: 0
CLASS: 0
CLASS: 0
CLASS: 0
CLASS: 0
CLASS: 0
CLASS: 0
CLASS: 0
CLASS: 0
COMMON RAGWEED (SHORT) (W1) IGE: <0.1 kU/L
Cat Dander: <0.1 kU/L
Class: 0
Class: 0
Class: 0
Class: 0
Class: 0
Class: 0
Class: 0
Class: 0
Class: 0
Class: 0
Class: 0
Class: 0
Cockroach: <0.1 kU/L
Dog Dander: <0.1 kU/L
IgE (Immunoglobulin E), Serum: 11 kU/L (ref <=114)
Johnson Grass: <0.1 kU/L
Pecan/Hickory Tree IgE: <0.1 kU/L

## 2017-06-03 LAB — INTERPRETATION:

## 2017-06-03 NOTE — Assessment & Plan Note (Addendum)
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   These are the same pts (now being labeled as having "irritable larynx syndrome" by some cough centers) who not infrequently have a history of having failed to tolerate ace inhibitors,  dry powder inhalers or biphosphonates or report having atypical/extraesophageal reflux symptoms that don't respond to standard doses of PPI  and are easily confused as having aecopd or asthma flares by even experienced allergists/ pulmonologists (myself included).    rec max rx for GERD ( NO MINTS!) suppress cough with tessalon awaiting allergy profile and use h1 at hs but zyrtec in am (if tolerates) to eliminate any pnds   If not better next step will be add trial of gabapentin.   I had an extended discussion with the patient reviewing all relevant studies completed to date and  lasting 15 to 20 minutes of a 25 minute visit    The standardized cough guidelines published in Chest by Lissa Morales in 2006 are still the best available and consist of a multiple step process (up to 12!) , not a single office visit,  and are intended  to address this problem logically,  with an alogrithm dependent on response to empiric  treatment at  each progressive step  to determine a specific diagnosis with  minimal addtional testing needed. Therefore if adherence is an issue or can't be accurately verified,  it's very unlikely the standard evaluation and treatment will be successful here.    Furthermore, response to therapy (other than acute cough suppression, which should only be used short term with avoidance of narcotic containing cough syrups if possible), can be a gradual process for which the patient is not likely to  perceive immediate benefit.  Unlike going to an eye doctor where the best perscription is almost always the first one and is immediately effective, this is almost never the case in the management of chronic cough syndromes. Therefore the patient needs to commit up front to consistently adhere to recommendations  for up to 6 weeks of therapy directed at the likely underlying problem(s) before the response can be reasonably evaluated.   Each maintenance medication was reviewed in detail including most importantly the difference between maintenance and prns and under what circumstances the prns are to be triggered using an action plan format that is not reflected in the computer generated alphabetically organized AVS.    Please see AVS for specific instructions unique to this visit that I personally wrote and verbalized to the the pt in detail and then reviewed with pt  by my nurse highlighting any  changes in therapy recommended at today's  visit to their plan of care.

## 2017-06-04 ENCOUNTER — Telehealth: Payer: Self-pay | Admitting: Internal Medicine

## 2017-06-04 NOTE — Telephone Encounter (Signed)
Notes recorded by Tanda Rockers, MD on 06/03/2017 at 4:22 PM EST Call patient : Studies are unremarkable, no change in recs  Advised pt of results. Pt understood and nothing further is needed.

## 2017-07-02 ENCOUNTER — Ambulatory Visit: Payer: 59 | Admitting: Internal Medicine

## 2017-07-14 ENCOUNTER — Ambulatory Visit: Payer: 59 | Admitting: Nurse Practitioner

## 2017-07-15 ENCOUNTER — Ambulatory Visit: Payer: Federal, State, Local not specified - PPO | Admitting: Internal Medicine

## 2017-07-15 ENCOUNTER — Encounter: Payer: Self-pay | Admitting: Internal Medicine

## 2017-07-15 VITALS — BP 134/80 | HR 83 | Ht 65.0 in | Wt 198.0 lb

## 2017-07-15 DIAGNOSIS — R05 Cough: Secondary | ICD-10-CM | POA: Diagnosis not present

## 2017-07-15 DIAGNOSIS — J45991 Cough variant asthma: Secondary | ICD-10-CM

## 2017-07-15 DIAGNOSIS — R059 Cough, unspecified: Secondary | ICD-10-CM

## 2017-07-15 MED ORDER — MONTELUKAST SODIUM 10 MG PO TABS: 10.0000 mg | ORAL_TABLET | Freq: Every day | ORAL | 11 refills | Status: DC

## 2017-07-15 NOTE — Progress Notes (Signed)
Subjective:     Patient ID: Jordan [name], female   DOB: 08/26/1975,     MRN: [id number]    Brief patient profile:  33 yobf  Never smoker with onset in middle school with fall sneezing/ watery rhinitis eventually rx clariton improved but over the years pattern has worsened in severity and evolved with fall > spring pattern  since late 20's but rarely needing prednisone   or inhalers self referred to pulmonary p 3rd year in a row severe episode of severe cough onset of transient sniffles  around mid nov 2018 but this time pred did not help / saba not sure but thinks it made the cough worse so self referred 06/02/2017 .    History of Present Illness  05/19/2017 1st [facility] office visit/ Jordan [name]   Chief Complaint  Patient presents with  . Pulmonary Consult    Self referral. Pt c/o cough for approx 2 wks. Cough is mainly non prod, but will occ cough up some clear to brown sputum. She states if she tried to lie down flat she coughs, and has been having to sleep in a recliner.  She states that she has a history of bronchitis. She states she is using her albuterol inhaler 3 x per day on average.   onset was acute this time mid Nov  2018 with nasal congestion Jordan [name] rhinitis/ cough/ eye watering, initial rx x 3 d pred and started cefuroxime 05/10/17 and no better > to ER   05/16/17 due to "bubbling in chest" and sob and unable to lie flat> er rx multiple nebs /  more pred completed 05/19/2017 plus tessalon 100 plus alb saba  - feeling some  better  X last 24 h but still unable to lie back in bed s severe coughing fits  rec Plan A = Automatic = symbicort 80 Take 2 puffs first thing in am and then another 2 puffs about 12 hours later.  Work on inhaler technique:  relax and gently blow all the way out then take a nice smooth deep breath back in, triggering the inhaler at same time you start breathing in.  Hold for up to 5 seconds if you can. Blow out thru nose. Rinse and gargle with water when  done Plan B = Backup for breathing  Only use your albuterol(PROAIR) as a rescue medication  Plan B = back up cough up  Tessilon 200 mg up to every 6 hours as needed  Plan B = for Drainage/ throat drainage For drainage / throat tickle try take CHLORPHENIRAMINE  4 mg - take one every 4 hours as needed - available over the counter- may cause drowsiness so start with just a bedtime dose or two and see how you tolerate it before trying in daytime   Prednisone 10 mg take  4 each am x 2 days,   2 each am x 2 days,  1 each am x 2 days and stop (this is to eliminate allergies and inflammation from coughing) Try Prilosec otc 20mg   Take 30-60 min before first meal of the day and Pepcid ac (famotidine) 20 mg one @  bedtime until cough is completely gone for at least a week without the need for cough suppression GERD diet     06/02/2017  f/u ov/Jordan [name] 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 Follow-up    Pt states that she is still coughing and states that she had a bad coughing  fit last night, 06/01/17. Pt states that her cough is usually a dry cough but has been having some clear nasal drainage Denies any SOB or CP.  stopped the symbicort 80 p a week with no more noct cough at all since then but maint on h1 and h2 at hs since last ov  Most of her problem occur with changes in temp or laughing during the day > severe coughing fits  avg tessalon is once a day in evening seems to help   Re chlortrimeton   can't take during the day due to drownsiness but tol zyrtec ok  In tpast  Sucking on peppemints all day (advised against in capital letters last ov) rec Take tessalon more consistently during the day to suppress urge to cough Continue pepcid and chlorpheniramine at bedtime Zyrtec 10 mg in am as needed for itching / sneezing / runny nose  Increase prilosec to 20 mg x 2 = protonix 40 mg Take 30-60 min before first meal of the day  Leave off symbicort for now      07/15/2017  f/u ov/Jordan [name] re: recurrent cough flare since Nov 2018 ? cough  variant asthma vs uacs from sinusitis  Chief Complaint  Patient presents with  . Follow-up    coughing-now non-productive, tired   75% better overall  But only close to 100% on prednisone  Most nocts doing fine on h1 h2 / most of the issue is daytime with voice use despite the protonix before bfast  Since stopped tessalon feels like needs mucinex  More  Cough worse as blows out   No obvious day to day or daytime variability or assoc excess/ purulent sputum or mucus plugs or hemoptysis or cp or chest tightness, subjective wheeze or overt sinus or hb symptoms. No unusual exposure hx or h/o childhood pna/ asthma or knowledge of premature birth.  Sleeping ok flat without nocturnal  or early am exacerbation  of respiratory  c/o's or need for noct saba. Also denies any obvious fluctuation of symptoms with weather or environmental changes or other aggravating or alleviating factors except as outlined above   Current Allergies, Complete Past Medical History, Past Surgical History, Family History, and Social History were reviewed in [employer] record.  ROS  The following are not active complaints unless bolded Hoarseness, sore throat, dysphagia, dental problems, itching, sneezing,  nasal congestion or discharge of excess mucus or purulent secretions, ear ache,   fever, chills, sweats, unintended wt loss or wt gain, classically pleuritic or exertional cp,  orthopnea pnd or leg swelling, presyncope, palpitations, abdominal pain, anorexia, nausea, vomiting, diarrhea  or change in bowel habits or change in bladder habits, change in stools or change in urine, dysuria, hematuria,  rash, arthralgias, visual complaints, headache, numbness, weakness or ataxia or problems with walking or coordination,  change in mood/affect or memory.        Current Meds  Medication Sig  . Cholecalciferol (VITAMIN D) 2000  units tablet Take 1 tablet (2,000 Units total) by mouth daily.  . pantoprazole (PROTONIX) 40 MG tablet Take 1 tablet (40 mg total) by mouth daily. Take 30-60 min before first meal of the day  . sodium chloride (OCEAN) 0.65 % SOLN nasal spray Place 1 spray as needed into both nostrils for congestion.  Marland Kitchen SYNTHROID 175 MCG tablet Take 1 tablet (175 mcg total) by mouth daily before breakfast.  Objective:   Physical Exam   amb obese bf freq throat clearing   07/15/2017        198   06/02/2017     197   05/19/17 196 lb (88.9 kg)  05/16/17 192 lb (87.1 kg)  05/10/17 193 lb 8 oz (87.8 kg)    Vital signs reviewed - Note on arrival 02 sats  96% on RA  HEENT: nl dentition, turbinates bilaterally, and oropharynx. Nl external ear canals without cough reflex   NECK :  without JVD/Nodes/TM/ nl carotid upstrokes bilaterally   LUNGS: no acc muscle use,  Nl contour chest which is clear to A and P bilaterally with  cough on exp maneuvers   CV:  RRR  no s3 or murmur or increase in P2, and no edema   ABD:  soft and nontender with nl inspiratory excursion in the supine position. No bruits or organomegaly appreciated, bowel sounds nl  MS:  Nl gait/ ext warm without deformities, calf tenderness, cyanosis or clubbing No obvious joint restrictions   SKIN: warm and dry without lesions    NEURO:  alert, approp, nl sensorium with  no motor or cerebellar deficits apparent.                 . Assessment:

## 2017-07-15 NOTE — Patient Instructions (Addendum)
Try adding singulair (montelukast) 10 mg daily    GERD (REFLUX)  is an extremely common cause of respiratory symptoms just like yours , many times with no obvious heartburn at all.    It can be treated with medication, but also with lifestyle changes including elevation of the head of your bed (ideally with 6 inch  bed blocks),  Smoking cessation, avoidance of late meals, excessive alcohol, and avoid fatty foods, chocolate, peppermint, colas, red wine, and acidic juices such as orange juice.  NO MINT OR MENTHOL PRODUCTS SO NO COUGH DROPS  USE SUGARLESS CANDY INSTEAD (Jolley ranchers or Stover's or Life Savers) or even ice chips will also do - the key is to swallow to prevent all throat clearing. NO OIL BASED VITAMINS - use powdered substitutes.   Please see patient coordinator before you leave today  to schedule methacholine challenge testing and sinus CT and I will call you to arrange to step

## 2017-07-18 ENCOUNTER — Encounter: Payer: Self-pay | Admitting: Internal Medicine

## 2017-07-18 NOTE — Assessment & Plan Note (Signed)
05/19/2017  After extensive coaching HFA effectiveness =    75%  > try symbicort 80 2bid  > only took a week then stopped - FENO 06/02/2017  =   7 - Allergy profile 06/02/2017 >  Eos 0.1 /  IgE 11 Rast neg    Steroid responsive cough suggest eos bronchitis/ rhinitis/sinusitis or cough variant asthma   rec  Sinus CT  MCt next  Discussed in detail all the  indications, usual  risks and alternatives  relative to the benefits with patient who agrees to proceed with w/u as outlined.     Each maintenance medication was reviewed in detail including most importantly the difference between maintenance and as needed and under what circumstances the prns are to be used.  Please see AVS for specific  Instructions which are unique to this visit and I personally typed out  which were reviewed in detail in writing with the patient and a copy provided.

## 2017-07-29 ENCOUNTER — Inpatient Hospital Stay: Admission: RE | Admit: 2017-07-29 | Payer: Self-pay | Source: Ambulatory Visit

## 2017-07-29 ENCOUNTER — Encounter (HOSPITAL_COMMUNITY): Payer: Self-pay

## 2017-08-28 ENCOUNTER — Other Ambulatory Visit: Payer: Self-pay | Admitting: Internal Medicine

## 2017-08-30 ENCOUNTER — Telehealth: Payer: Self-pay | Admitting: Certified Nurse Midwife

## 2017-08-30 NOTE — Telephone Encounter (Signed)
Spoke with patient. Patient states that she has been having on going intermittent right breast pain for 2-3 months. Within the past week she noticed red "blotches" on her right breast that are not itchy or painful. No changes in lotions, soaps, or detergents. Advised will need to be seen for further evaluation. Appointment scheduled for 09/03/2017 at 12:45 pm with Melvia Heaps CNM. Declines all other dates and times offered. Aware if symptoms worsen she will need to be seen earlier for evaluation.  Routing to provider for final review. Patient agreeable to disposition. Will close encounter.

## 2017-08-30 NOTE — Telephone Encounter (Signed)
Patient has two red spots on her breast along with some pain. Last seen 11/10/16.

## 2017-09-03 ENCOUNTER — Ambulatory Visit: Payer: 59 | Admitting: Certified Nurse Midwife

## 2017-09-08 ENCOUNTER — Encounter: Payer: Self-pay | Admitting: Certified Nurse Midwife

## 2017-09-08 ENCOUNTER — Other Ambulatory Visit: Payer: Self-pay

## 2017-09-08 ENCOUNTER — Ambulatory Visit: Payer: Federal, State, Local not specified - PPO | Admitting: Certified Nurse Midwife

## 2017-09-08 VITALS — BP 114/70 | HR 70 | Resp 16 | Ht 65.5 in | Wt 200.0 lb

## 2017-09-08 DIAGNOSIS — N644 Mastodynia: Secondary | ICD-10-CM

## 2017-09-08 DIAGNOSIS — N6310 Unspecified lump in the right breast, unspecified quadrant: Secondary | ICD-10-CM | POA: Diagnosis not present

## 2017-09-08 NOTE — Patient Instructions (Signed)

## 2017-09-08 NOTE — Progress Notes (Signed)
   Subjective:   42 y.o. Married [place] American female presents for evaluation of right breast mass. Onset of the symptoms was new months ago. Patient sought evaluation because of breast tenderness and slight skin rash.  Contributing factors include family hx on mother's side and family hx on father's side aunts only with diagnosis in late 25.Marland Kitchen Denies chills or fever or nausea. Patient denies hiistory of trauma, bites, or injuries. Has discomfort when spouse "grabs breast" Last mammogram was 12/17/16 all normal with Breast density B..  Previous evaluation has included diffuse tenderness at age 79 with normal mammogram, fibroglandular parenchyma noted bilateral only. Tenderness resolved.   Review of Systems Pertinent items are noted in HPI.   Objective:   General appearance: alert, cooperative and no distress Breasts: normal appearance, no masses or tenderness, No nipple retraction or dimpling, No nipple discharge or bleeding, No axillary or supraclavicular adenopathy, right breast tenderness in ROQ from 9 to 11 o'clock with Cystic feel in same area, best felt in sitting position ? Mass noted in this area or cluster.  Assessment:   ASSESSMENT:Patient is diagnosed with right breast tenderness with ? Mass noted vs cystic feel Plan:   PLAN: Discussed findings with patient and need to evaluate with diagnostic mammogram and [place] bilateral recommended. Patient agreeable. Will be scheduled prior to leaving today.  Rv prn

## 2017-09-08 NOTE — Progress Notes (Signed)
Scheduled patient while in office for right breast diagnostic mammogram with ultrasound at the [facility] on 09/13/2017 at 1 pm. Patient is agreeable to date and time. Placed in mammogram hold.

## 2017-09-13 ENCOUNTER — Ambulatory Visit
Admission: RE | Admit: 2017-09-13 | Discharge: 2017-09-13 | Disposition: A | Discharge status: Home / Self Care | Payer: Federal, State, Local not specified - PPO | Source: Ambulatory Visit | Attending: Certified Nurse Midwife | Admitting: Certified Nurse Midwife

## 2017-09-13 DIAGNOSIS — N6310 Unspecified lump in the right breast, unspecified quadrant: Secondary | ICD-10-CM

## 2017-09-13 DIAGNOSIS — N644 Mastodynia: Secondary | ICD-10-CM | POA: Diagnosis not present

## 2017-09-13 DIAGNOSIS — R928 Other abnormal and inconclusive findings on diagnostic imaging of breast: Secondary | ICD-10-CM | POA: Diagnosis not present

## 2017-09-15 ENCOUNTER — Telehealth: Payer: Self-pay | Admitting: *Deleted

## 2017-09-15 NOTE — Telephone Encounter (Signed)
Agree with follow up plan discussed with patient to wait until exam unless there are changes.

## 2017-09-15 NOTE — Telephone Encounter (Signed)
Routing to Melvia Heaps, CNM FYI, will close encounter.    Notes recorded by Burnice Logan, RN on 09/15/2017 at 1:55 PM EDT Spoke with patient, advised as seen below per Melvia Heaps, CNM. Patient states she is scheduled for [facility]EX 11/11/17, would prefer recheck at that time. Advised patient will update Melvia Heaps, CNM. Return call to office prior to appointment if any changes or concerns. Patient verbalizes understanding.   See telephone encounter dated 09/15/17 to review with provider.

## 2017-09-15 NOTE — Telephone Encounter (Signed)
-----   Message from Regina Eck, CNM sent at 09/14/2017  7:42 AM EDT ----- Notify patient reviewed [place] and diagnostic mammogram, with only fibroglandular tissue noted, no cyst. Needs to follow up to see if tenderness has resolved and recheck area.

## 2017-11-11 ENCOUNTER — Ambulatory Visit: Payer: 59 | Admitting: Certified Nurse Midwife

## 2017-12-21 ENCOUNTER — Ambulatory Visit: Payer: Federal, State, Local not specified - PPO | Admitting: Certified Nurse Midwife

## 2017-12-21 ENCOUNTER — Telehealth: Payer: Self-pay | Admitting: Certified Nurse Midwife

## 2017-12-21 NOTE — Telephone Encounter (Signed)
Patient canceled her aex appointment today due to starting her cycle. Patient rescheduled to 12/29/17 at 1:00pm.

## 2017-12-21 NOTE — Progress Notes (Deleted)
42 y.o. [id number] Married  {Race/ethnicity:17218} Fe here for annual exam.    No LMP recorded.          Sexually active: {yes no:314532}  The current method of family planning is tubal ligation.    Exercising: {yes no:314532}  {types:19826} Smoker:  {YES NO:22349}  Health Maintenance: Pap:  09-11-13 neg HPV HR neg, 10-11-15 neg HPV HR neg History of Abnormal Pap: {YES NO:22349} MMG:  09-13-17 diagnostic rt mmg & rt breast u/s category b density birads 1:neg, f/u bilateral 23mths at yearly screening Self Breast exams: {YES NO:22349} Colonoscopy:  none BMD:   none TDaP:  2009 Shingles: no Pneumonia: no Hep C and HIV: HIV neg during pregnancy Labs: ***   reports that she has never smoked. She has never used smokeless tobacco. She reports that she does not drink alcohol or use drugs.  Past Medical History:  Diagnosis Date  . Complication of anesthesia 2009   failed epidural, severe pain from second attempt  . HSV-2 infection   . Hypothyroid    graves    Past Surgical History:  Procedure Laterality Date  . CESAREAN SECTION    . DILATION AND CURETTAGE OF UTERUS  1995   TAB  . HYSTEROSCOPY  03/21/2012   Procedure: HYSTEROSCOPY;  Surgeon: Marvene Staff, MD;  Location: [facility];  Service: Gynecology;;  . IUD REMOVAL  03/21/2012   Procedure: INTRAUTERINE DEVICE (IUD) REMOVAL;  Surgeon: Marvene Staff, MD;  Location: [facility];  Service: Gynecology;  Laterality: N/A;  . LAPAROSCOPIC TUBAL LIGATION  03/21/2012   Procedure: LAPAROSCOPIC TUBAL LIGATION;  Surgeon: Marvene Staff, MD;  Location: [facility];  Service: Gynecology;  Laterality: Bilateral;  with cautery  . SUPERFICIAL LYMPH NODE BIOPSY / EXCISION    . TUBAL LIGATION    . UTERINE SUSPENSION  2007    Current Outpatient Medications  Medication Sig Dispense Refill  . cetirizine (ZYRTEC) 10 MG tablet Take 10 mg by mouth daily.    . Cholecalciferol (VITAMIN D) 2000 units tablet Take 1 tablet (2,000 Units total) by mouth  daily.    . Multiple Vitamins-Minerals (MULTIVITAMIN PO) Take by mouth.    . pantoprazole (PROTONIX) 40 MG tablet TAKE 1 TABLET (40 MG TOTAL) BY MOUTH DAILY. TAKE 30-60 MIN BEFORE FIRST MEAL OF THE DAY (Patient taking differently: Take 40 mg by mouth as needed. Take 30-60 min before first meal of the day) 30 tablet 2  . SYNTHROID 175 MCG tablet Take 1 tablet (175 mcg total) by mouth daily before breakfast. 90 tablet 3   No current facility-administered medications for this visit.     Family History  Problem Relation Age of Onset  . Hypertension Mother   . Allergies Mother   . Rheum arthritis Mother   . Hypertension Father   . Allergies Father   . Breast cancer Paternal Aunt   . Thyroid disease Paternal Aunt   . Breast cancer Maternal Aunt   . Thyroid disease Brother   . Stroke Maternal Grandmother   . Stroke Paternal Grandmother   . Thyroid disease Paternal Grandmother   . Thyroid disease Paternal Aunt     ROS:  Pertinent items are noted in HPI.  Otherwise, a comprehensive ROS was negative.  Exam:   There were no vitals taken for this visit.   Ht Readings from Last 3 Encounters:  09/08/17 5' 5.5" (1.664 m)  07/15/17 5\' 5"  (1.651 m)  06/02/17 5\' 5"  (1.651 m)    General appearance:  alert, cooperative and appears stated age Head: Normocephalic, without obvious abnormality, atraumatic Neck: no adenopathy, supple, symmetrical, trachea midline and thyroid {EXAM; THYROID:18604} Lungs: clear to auscultation bilaterally Breasts: {Exam; breast:13139::"normal appearance, no masses or tenderness"} Heart: regular rate and rhythm Abdomen: soft, non-tender; no masses,  no organomegaly Extremities: extremities normal, atraumatic, no cyanosis or edema Skin: Skin color, texture, turgor normal. No rashes or lesions Lymph nodes: Cervical, supraclavicular, and axillary nodes normal. No abnormal inguinal nodes palpated Neurologic: Grossly normal   Pelvic: External genitalia:  no lesions               Urethra:  normal appearing urethra with no masses, tenderness or lesions              Bartholin's and Skene's: normal                 Vagina: normal appearing vagina with normal color and discharge, no lesions              Cervix: {exam; cervix:14595}              Pap taken: {yes no:314532} Bimanual Exam:  Uterus:  {exam; uterus:12215}              Adnexa: {exam; adnexa:12223}               Rectovaginal: Confirms               Anus:  normal sphincter tone, no lesions  Chaperone present: ***  A:  Well Woman with normal exam  P:   Reviewed health and wellness pertinent to exam  Pap smear: {YES NO:22349}  {plan; gyn:5269::"mammogram","pap smear","return annually or prn"}  An After Visit Summary was printed and given to the patient.

## 2017-12-29 ENCOUNTER — Ambulatory Visit: Payer: Federal, State, Local not specified - PPO | Admitting: Certified Nurse Midwife

## 2018-01-18 ENCOUNTER — Ambulatory Visit: Payer: Federal, State, Local not specified - PPO | Admitting: Certified Nurse Midwife

## 2018-01-19 NOTE — Progress Notes (Signed)
42 y.o. Jordan [name] Married  African American Fe here for annual exam. Worked on weight loss in 2018 with exercise boot camp with dropping pound 24 pounds. Had bronchitis and went off diet. Has gained all the weight back, planning to start back.  Sees [name] for aex and labs.[facility] yearly for Hypothyroid management,stable, has appointment soon. No heath issues today, except weight gain. Plans some vacation at some point!  Patient's last menstrual period was 01/15/2018 (exact date).          Sexually active: Yes.    The current method of family planning is tubal ligation.    Exercising: No.  exercise Smoker:  no  Review of Systems  Constitutional: Negative.   HENT: Negative.   Eyes: Negative.   Respiratory: Negative.   Cardiovascular: Negative.   Gastrointestinal: Negative.   Genitourinary: Negative.   Musculoskeletal: Negative.   Skin: Negative.   Neurological: Negative.   Endo/Heme/Allergies: Negative.   Psychiatric/Behavioral: Negative.     Health Maintenance: Pap:  10-11-15 neg HPV HR neg History of Abnormal Pap: no MMG:  Bilateral 6/18 & 3/19 rt breast u/s birads 1:neg Self Breast exams: yes Colonoscopy:  none BMD:   none TDaP:  2009 will do at [place] Shingles: no Pneumonia: no Hep C and HIV: HIV neg during pregnancy Labs: if needed   reports that she has never smoked. She has never used smokeless tobacco. She reports that she does not drink alcohol or use drugs.  Past Medical History:  Diagnosis Date  . Complication of anesthesia 2009   failed epidural, severe pain from second attempt  . HSV-2 infection   . Hypothyroid    graves    Past Surgical History:  Procedure Laterality Date  . CESAREAN SECTION    . DILATION AND CURETTAGE OF UTERUS  1995   TAB  . HYSTEROSCOPY  03/21/2012   Procedure: HYSTEROSCOPY;  Surgeon: Marvene Staff, MD;  Location: [facility];  Service: Gynecology;;  . IUD REMOVAL  03/21/2012   Procedure: INTRAUTERINE DEVICE (IUD)  REMOVAL;  Surgeon: Marvene Staff, MD;  Location: [facility];  Service: Gynecology;  Laterality: N/A;  . LAPAROSCOPIC TUBAL LIGATION  03/21/2012   Procedure: LAPAROSCOPIC TUBAL LIGATION;  Surgeon: Marvene Staff, MD;  Location: [facility];  Service: Gynecology;  Laterality: Bilateral;  with cautery  . SUPERFICIAL LYMPH NODE BIOPSY / EXCISION    . TUBAL LIGATION    . UTERINE SUSPENSION  2007    Current Outpatient Medications  Medication Sig Dispense Refill  . cetirizine (ZYRTEC) 10 MG tablet Take 10 mg by mouth daily.    . Multiple Vitamins-Minerals (MULTIVITAMIN PO) Take by mouth.    . pantoprazole (PROTONIX) 40 MG tablet TAKE 1 TABLET (40 MG TOTAL) BY MOUTH DAILY. TAKE 30-60 MIN BEFORE FIRST MEAL OF THE DAY (Patient taking differently: Take 40 mg by mouth as needed. Take 30-60 min before first meal of the day) 30 tablet 2  . SYNTHROID 175 MCG tablet Take 1 tablet (175 mcg total) by mouth daily before breakfast. 90 tablet 3   No current facility-administered medications for this visit.     Family History  Problem Relation Age of Onset  . Hypertension Mother   . Allergies Mother   . Rheum arthritis Mother   . Hypertension Father   . Allergies Father   . Breast cancer Paternal Aunt   . Thyroid disease Paternal Aunt   . Breast cancer Maternal Aunt   . Thyroid disease Brother   .  Stroke Maternal Grandmother   . Stroke Paternal Grandmother   . Thyroid disease Paternal Grandmother   . Thyroid disease Paternal Aunt     ROS:  Pertinent items are noted in HPI.  Otherwise, a comprehensive ROS was negative.  Exam:   BP 118/80   Pulse 68   Resp 16   Ht 5\' 5"  (1.651 m)   Wt 202 lb (91.6 kg)   LMP 01/15/2018 (Exact Date)   BMI 33.61 kg/m  Height: 5\' 5"  (165.1 cm) Ht Readings from Last 3 Encounters:  01/20/18 5\' 5"  (1.651 m)  09/08/17 5' 5.5" (1.664 m)  07/15/17 5\' 5"  (1.651 m)    General appearance: alert, cooperative and appears stated age Head: Normocephalic, without  obvious abnormality, atraumatic Neck: no adenopathy, supple, symmetrical, trachea midline and thyroid normal to inspection and palpation Lungs: clear to auscultation bilaterally Breasts: normal appearance, no masses or tenderness, No nipple retraction or dimpling, No nipple discharge or bleeding, No axillary or supraclavicular adenopathy Heart: regular rate and rhythm Abdomen: soft, non-tender; no masses,  no organomegaly Extremities: extremities normal, atraumatic, no cyanosis or edema Skin: Skin color, texture, turgor normal. No rashes or lesions Lymph nodes: Cervical, supraclavicular, and axillary nodes normal. No abnormal inguinal nodes palpated Neurologic: Grossly normal   Pelvic: External genitalia:  no lesions,normal femlae              Urethra:  normal appearing urethra with no masses, tenderness or lesions              Bartholin's and Skene's: normal                 Vagina: normal appearing vagina with normal color and discharge, no lesions              Cervix: no bleeding following Pap, no cervical motion tenderness and no lesions              Pap taken: Yes.   Bimanual Exam:  Uterus:  normal size, contour, position, consistency, mobility, non-tender and anteverted              Adnexa: normal adnexa and no mass, fullness, tenderness               Rectovaginal: Confirms               Anus:  normal sphincter tone, no lesions  Chaperone present: yes  A:  Well Woman with normal exam  Contraception Tubal ligation  Weight gain after weight loss  History of HSV 2 needs Rx update  Hypothyroid with Endocrine management  Immunization update  P:   Reviewed health and wellness pertinent to exam  Discussed she has accomplished weight loss before and can do it again. Take time out if she is getting to fatigued with work out to prevent illness. Suggested daily probiotic use which can be helpful. Questions addressed  Rx Valtrex see order with instructions  Continue follow up with MD as  indicated  Declines TDAP will do with PCP  Pap smear: yes   counseled on breast self exam, mammography screening, feminine hygiene, adequate intake of calcium and vitamin D, diet and exercise  return annually or prn  An After Visit Summary was printed and given to the patient.

## 2018-01-20 ENCOUNTER — Other Ambulatory Visit (HOSPITAL_COMMUNITY)
Admission: RE | Admit: 2018-01-20 | Discharge: 2018-01-20 | Disposition: A | Discharge status: Home / Self Care | Payer: Federal, State, Local not specified - PPO | Source: Ambulatory Visit | Attending: Certified Nurse Midwife | Admitting: Certified Nurse Midwife

## 2018-01-20 ENCOUNTER — Ambulatory Visit (INDEPENDENT_AMBULATORY_CARE_PROVIDER_SITE_OTHER): Payer: Federal, State, Local not specified - PPO | Admitting: Certified Nurse Midwife

## 2018-01-20 ENCOUNTER — Encounter: Payer: Self-pay | Admitting: Certified Nurse Midwife

## 2018-01-20 ENCOUNTER — Other Ambulatory Visit: Payer: Self-pay

## 2018-01-20 VITALS — BP 118/80 | HR 68 | Resp 16 | Ht 65.0 in | Wt 202.0 lb

## 2018-01-20 DIAGNOSIS — Z8619 Personal history of other infectious and parasitic diseases: Secondary | ICD-10-CM

## 2018-01-20 DIAGNOSIS — Z124 Encounter for screening for malignant neoplasm of cervix: Secondary | ICD-10-CM | POA: Diagnosis not present

## 2018-01-20 DIAGNOSIS — Z01419 Encounter for gynecological examination (general) (routine) without abnormal findings: Secondary | ICD-10-CM

## 2018-01-20 DIAGNOSIS — Z8639 Personal history of other endocrine, nutritional and metabolic disease: Secondary | ICD-10-CM | POA: Diagnosis not present

## 2018-01-20 MED ORDER — VALACYCLOVIR HCL 1 G PO TABS: ORAL_TABLET | ORAL | 12 refills | Status: DC

## 2018-01-20 NOTE — Patient Instructions (Signed)

## 2018-01-21 LAB — CYTOLOGY - PAP
Diagnosis: NEGATIVE
HPV (WINDOPATH): NOT DETECTED

## 2018-02-10 ENCOUNTER — Ambulatory Visit: Payer: 59 | Admitting: Internal Medicine

## 2018-03-06 ENCOUNTER — Other Ambulatory Visit: Payer: Self-pay | Admitting: Internal Medicine

## 2018-03-16 DIAGNOSIS — G5603 Carpal tunnel syndrome, bilateral upper limbs: Secondary | ICD-10-CM | POA: Diagnosis not present

## 2018-03-21 ENCOUNTER — Encounter: Payer: Self-pay | Admitting: Neurology

## 2018-03-22 ENCOUNTER — Other Ambulatory Visit: Payer: Self-pay | Admitting: *Deleted

## 2018-03-22 ENCOUNTER — Ambulatory Visit: Payer: Federal, State, Local not specified - PPO | Admitting: Internal Medicine

## 2018-03-22 ENCOUNTER — Encounter: Payer: Self-pay | Admitting: Internal Medicine

## 2018-03-22 VITALS — BP 130/80 | HR 78 | Ht 65.0 in | Wt 203.0 lb

## 2018-03-22 DIAGNOSIS — E890 Postprocedural hypothyroidism: Secondary | ICD-10-CM

## 2018-03-22 DIAGNOSIS — Z23 Encounter for immunization: Secondary | ICD-10-CM

## 2018-03-22 DIAGNOSIS — G5603 Carpal tunnel syndrome, bilateral upper limbs: Secondary | ICD-10-CM

## 2018-03-22 LAB — T4, FREE: FREE T4: 1.08 ng/dL (ref 0.60–1.60)

## 2018-03-22 LAB — TSH: TSH: 0.86 u[IU]/mL (ref 0.35–4.50)

## 2018-03-22 MED ORDER — SYNTHROID 175 MCG PO TABS: 175.0000 ug | ORAL_TABLET | Freq: Every day | ORAL | 3 refills | Status: DC

## 2018-03-22 NOTE — Patient Instructions (Signed)
Please stop at the lab.  Please continue Synthroid 175 mcg daily.  Take the thyroid hormone every day, with water, at least 30 minutes before breakfast, separated by at least 4 hours from: - acid reflux medications - calcium - iron - multivitamins  Please return in 1 year.

## 2018-03-22 NOTE — Progress Notes (Signed)
Subjective:     Patient ID: Katheren Puller, female   DOB: 10-16-1975, 42 y.o.   MRN: [id number]  HPI Ms Akkerman is a 42 y.o. woman, returning for followup for hypothyroidism post RAI ablation for Graves' disease in 1997. Last visit 1 year ago.  At last visit, she was doing a boot camp and lost 10 pounds by increased exercise and also eliminating juices from her diet. She stopped boot camp when she developed bronchitis in Spring.  She has tendinitis/carpal tunnel >> started Meloxicam.  Reviewed her TFTs: Lab Results  Component Value Date   TSH 2.54 02/09/2017   TSH 3.28 08/20/2015   TSH 0.85 02/18/2015   TSH 0.36 10/18/2014   TSH 40.089 (H) 09/13/2014   TSH 0.58 03/03/2013   FREET4 1.01 02/09/2017   FREET4 0.94 08/20/2015   FREET4 0.90 02/18/2015   FREET4 0.91 10/18/2014   FREET4 1.01 03/03/2013   FREET4 0.63 11/25/2012   Pt is on Synthroid 175 mcg daily, taken: - in am - fasting - at least 30 min from b'fast - no Ca, Fe, PPIs - + MVIs at night - not on Biotin  Pt denies: - feeling nodules in neck - hoarseness - dysphagia - choking - SOB with lying down  No FH of thyroid cancer. No h/o radiation tx to head or neck.  No seaweed or kelp. No recent contrast studies. No herbal supplements. No Biotin use. No recent steroids use.   Review of Systems Constitutional: no weight gain/no weight loss, no fatigue, no subjective hyperthermia, no subjective hypothermia Eyes: no blurry vision, no xerophthalmia ENT: no sore throat, + see HPI Cardiovascular: no CP/no SOB/no palpitations/no leg swelling Respiratory: no cough/no SOB/no wheezing Gastrointestinal: no N/no V/no D/no C/no acid reflux Musculoskeletal: no muscle aches/no joint aches Skin: no rashes, no hair loss Neurological: no tremors/no numbness/no tingling/no dizziness  I reviewed pt's medications, allergies, PMH, social hx, family hx, and changes were documented in the history of present illness. Otherwise,  unchanged from my initial visit note.  Past Medical History:  Diagnosis Date  . Complication of anesthesia 2009   failed epidural, severe pain from second attempt  . HSV-2 infection   . Hypothyroid    graves   Past Surgical History:  Procedure Laterality Date  . CESAREAN SECTION    . DILATION AND CURETTAGE OF UTERUS  1995   TAB  . HYSTEROSCOPY  03/21/2012   Procedure: HYSTEROSCOPY;  Surgeon: Marvene Staff, MD;  Location: [facility];  Service: Gynecology;;  . IUD REMOVAL  03/21/2012   Procedure: INTRAUTERINE DEVICE (IUD) REMOVAL;  Surgeon: Marvene Staff, MD;  Location: [facility];  Service: Gynecology;  Laterality: N/A;  . LAPAROSCOPIC TUBAL LIGATION  03/21/2012   Procedure: LAPAROSCOPIC TUBAL LIGATION;  Surgeon: Marvene Staff, MD;  Location: [facility];  Service: Gynecology;  Laterality: Bilateral;  with cautery  . SUPERFICIAL LYMPH NODE BIOPSY / EXCISION    . TUBAL LIGATION    . UTERINE SUSPENSION  2007   Social History   Socioeconomic History  . Marital status: Married    Spouse name: Not on file  . Number of children: Not on file  . Years of education: Not on file  . Highest education level: Not on file  Occupational History  . Not on file  Social Needs  . Financial resource strain: Not on file  . Food insecurity:    Worry: Not on file    Inability: Not on file  . Transportation needs:  Medical: Not on file    Non-medical: Not on file  Tobacco Use  . Smoking status: Never Smoker  . Smokeless tobacco: Never Used  Substance and Sexual Activity  . Alcohol use: No    Alcohol/week: 0.0 standard drinks    Comment: once every 2 mths  . Drug use: No  . Sexual activity: Yes    Partners: Male    Birth control/protection: Surgical    Comment: tubal ligation  Lifestyle  . Physical activity:    Days per week: Not on file    Minutes per session: Not on file  . Stress: Not on file  Relationships  . Social connections:    Talks on phone: Not on file    Gets  together: Not on file    Attends religious service: Not on file    Active member of club or organization: Not on file    Attends meetings of clubs or organizations: Not on file    Relationship status: Not on file  . Intimate partner violence:    Fear of current or ex partner: Not on file    Emotionally abused: Not on file    Physically abused: Not on file    Forced sexual activity: Not on file  Other Topics Concern  . Not on file  Social History Narrative  . Not on file   Current Outpatient Medications on File Prior to Visit  Medication Sig Dispense Refill  . cetirizine (ZYRTEC) 10 MG tablet Take 10 mg by mouth daily.    . Multiple Vitamins-Minerals (MULTIVITAMIN PO) Take by mouth.    . pantoprazole (PROTONIX) 40 MG tablet TAKE 1 TABLET (40 MG TOTAL) BY MOUTH DAILY. TAKE 30-60 MIN BEFORE FIRST MEAL OF THE DAY (Patient taking differently: Take 40 mg by mouth as needed. Take 30-60 min before first meal of the day) 30 tablet 2  . SYNTHROID 175 MCG tablet TAKE 1 TABLET (175 MCG TOTAL) BY MOUTH DAILY BEFORE BREAKFAST. 30 tablet 1  . valACYclovir (VALTREX) 1000 MG tablet Take one tablet twice daily for 3 days, extend to 10 days if symptoms have not resolved 30 tablet 12  . meloxicam (MOBIC) 15 MG tablet      No current facility-administered medications on file prior to visit.    No Known Allergies Family History  Problem Relation Age of Onset  . Hypertension Mother   . Allergies Mother   . Rheum arthritis Mother   . Hypertension Father   . Allergies Father   . Breast cancer Paternal Aunt   . Thyroid disease Paternal Aunt   . Breast cancer Maternal Aunt   . Thyroid disease Brother   . Stroke Maternal Grandmother   . Stroke Paternal Grandmother   . Thyroid disease Paternal Grandmother   . Thyroid disease Paternal Aunt     Objective:   Physical Exam BP 130/80   Pulse 78   Ht 5\' 5"  (1.651 m) Comment: measured  Wt 203 lb (92.1 kg)   LMP 03/04/2018   SpO2 97%   BMI 33.78 kg/m   Body mass index is 33.78 kg/m. Wt Readings from Last 3 Encounters:  03/22/18 203 lb (92.1 kg)  01/20/18 202 lb (91.6 kg)  09/08/17 200 lb (90.7 kg)   Constitutional: overweight, in NAD Eyes: PERRLA, EOMI, no exophthalmos ENT: moist mucous membranes, no thyromegaly, no cervical lymphadenopathy Cardiovascular: RRR, No MRG Respiratory: CTA B Gastrointestinal: abdomen soft, NT, ND, BS+ Musculoskeletal: no deformities, strength intact in all 4 Skin: moist, warm,  no rashes Neurological: no tremor with outstretched hands, DTR normal in all 4  Assessment:     1. Iatrogenic hypothyroidism - post ablation for Graves' disease in 1997     Plan:     Pt with long-standing post ablative hypothyroidism, previously with variable control due to noncompliance with her levothyroxine, now with normal TFTs after she started to take the medication as prescribed. - latest thyroid labs reviewed with pt >> normal a year ago, at last visit - she continues on LT4 d.a.w. 175 mcg daily - pt feels good on this dose. No complaints at this visit. - we discussed about taking the thyroid hormone every day, with water, >30 minutes before breakfast, separated by >4 hours from acid reflux medications, calcium, iron, multivitamins. Pt. is taking it correctly. - will check thyroid tests today: TSH and fT4 - If labs are abnormal, she will need to return for repeat TFTs in 1.5 months  Needs refills.  - time spent with the patient: 15 min, of which >50% was spent in obtaining information about her symptoms, reviewing her previous labs, evaluations, and treatments, counseling her about her condition (please see the discussed topics above), and developing a plan to further investigate and treat it.  Office Visit on 03/22/2018  Component Date Value Ref Range Status  . TSH 03/22/2018 0.86  0.35 - 4.50 uIU/mL Final  . Free T4 03/22/2018 1.08  0.60 - 1.60 ng/dL Final   Comment: Specimens from patients who are undergoing  biotin therapy and /or ingesting biotin supplements may contain high levels of biotin.  The higher biotin concentration in these specimens interferes with this Free T4 assay.  Specimens that contain high levels  of biotin may cause false high results for this Free T4 assay.  Please interpret results in light of the total clinical presentation of the patient.     Thyroid tests are great!  Philemon Kingdom, MD PhD [facility]

## 2018-04-05 ENCOUNTER — Encounter: Payer: Federal, State, Local not specified - PPO | Admitting: Neurology

## 2018-04-08 ENCOUNTER — Other Ambulatory Visit: Payer: Self-pay | Admitting: Certified Nurse Midwife

## 2018-04-08 DIAGNOSIS — Z1231 Encounter for screening mammogram for malignant neoplasm of breast: Secondary | ICD-10-CM

## 2018-04-19 ENCOUNTER — Encounter

## 2018-04-19 ENCOUNTER — Ambulatory Visit (INDEPENDENT_AMBULATORY_CARE_PROVIDER_SITE_OTHER): Payer: Federal, State, Local not specified - PPO | Admitting: Neurology

## 2018-04-19 DIAGNOSIS — G5603 Carpal tunnel syndrome, bilateral upper limbs: Secondary | ICD-10-CM | POA: Diagnosis not present

## 2018-04-19 NOTE — Procedures (Signed)
[facility]  [address]  [place], [place] [address] Tel: [phone] Fax:  [phone] Test Date:  04/19/2018  Patient: Jordan [name] DOB: 01-02-1976 Physician: Narda Amber, DO  Sex: Female Height: 5\' 5"  Ref Phys: Wandra Feinstein, MD  ID#: [id number] Temp: 32.0C Technician:    Patient Complaints: This is a 42 year old female referred for evaluation of bilateral wrist pain and hand paresthesias.  NCV & EMG Findings: Extensive electrodiagnostic testing of the right upper extremity and additional studies of the left shows:  1. Bilateral mixed palmar sensory responses are prolonged latency.  Bilateral median and ulnar sensory responses are within normal limits. 2. Bilateral median and ulnar motor responses are within normal limits. 3. There is no evidence of active or chronic motor axonal loss changes affecting any of the tested muscles.  Motor unit configuration and recruitment pattern is within normal limits.  Impression: Bilateral median neuropathy at or distal to the wrist, consistent with the clinical diagnosis of carpal tunnel syndrome.  Overall, these findings are mild in degree electrically.   ___________________________ Narda Amber, DO    Nerve Conduction Studies Anti Sensory Summary Table   Site NR Peak (ms) Norm Peak (ms) P-T Amp (V) Norm P-T Amp  Left Median Anti Sensory (2nd Digit)  32C  Wrist    3.3 <3.4 52.3 >20  Right Median Anti Sensory (2nd Digit)  32C  Wrist    3.3 <3.4 42.4 >20  Left Ulnar Anti Sensory (5th Digit)  32C  Wrist    2.8 <3.1 53.8 >12  Right Ulnar Anti Sensory (5th Digit)  32C  Wrist    2.4 <3.1 53.2 >12   Motor Summary Table   Site NR Onset (ms) Norm Onset (ms) O-P Amp (mV) Norm O-P Amp Site1 Site2 Delta-0 (ms) Dist (cm) Vel (m/s) Norm Vel (m/s)  Left Median Motor (Abd Poll Brev)  32C  Wrist    2.9 <3.9 14.0 >6 Elbow Wrist 4.8 30.0 62 >50  Elbow    7.7  13.7         Right Median Motor (Abd Poll Brev)   32C  Wrist    2.7 <3.9 12.0 >6 Elbow Wrist 4.9 29.0 59 >50  Elbow    7.6  12.0         Left Ulnar Motor (Abd Dig Minimi)  32C  Wrist    2.3 <3.1 9.3 >7 B Elbow Wrist 3.6 24.0 67 >50  B Elbow    5.9  9.3  A Elbow B Elbow 1.6 10.0 63 >50  A Elbow    7.5  9.1         Right Ulnar Motor (Abd Dig Minimi)  32C  Wrist    1.9 <3.1 9.0 >7 B Elbow Wrist 3.8 25.0 66 >50  B Elbow    5.7  9.0  A Elbow B Elbow 1.6 10.0 63 >50  A Elbow    7.3  9.0          Comparison Summary Table   Site NR Peak (ms) Norm Peak (ms) P-T Amp (V) Site1 Site2 Delta-P (ms) Norm Delta (ms)  Left Median/Ulnar Palm Comparison (Wrist - 8cm)  32C  Median Palm    2.1 <2.2 70.4 Median Palm Ulnar Palm 0.4   Ulnar Palm    1.7 <2.2 12.9      Right Median/Ulnar Palm Comparison (Wrist - 8cm)  32C  Median Palm    2.3 <2.2 31.3 Median Palm Ulnar Palm 0.9  Ulnar Palm    1.4 <2.2 15.6       EMG   Side Muscle Ins Act Fibs Psw Fasc Number Recrt Dur Dur. Amp Amp. Poly Poly. Comment  Right Abd Poll Brev Nml Nml Nml Nml Nml Nml Nml Nml Nml Nml Nml Nml N/A  Right 1stDorInt Nml Nml Nml Nml Nml Nml Nml Nml Nml Nml Nml Nml N/A  Right PronatorTeres Nml Nml Nml Nml Nml Nml Nml Nml Nml Nml Nml Nml N/A  Right Biceps Nml Nml Nml Nml Nml Nml Nml Nml Nml Nml Nml Nml N/A  Right Triceps Nml Nml Nml Nml Nml Nml Nml Nml Nml Nml Nml Nml N/A  Right Deltoid Nml Nml Nml Nml Nml Nml Nml Nml Nml Nml Nml Nml N/A  Left 1stDorInt Nml Nml Nml Nml Nml Nml Nml Nml Nml Nml Nml Nml N/A  Left Abd Poll Brev Nml Nml Nml Nml Nml Nml Nml Nml Nml Nml Nml Nml N/A  Left PronatorTeres Nml Nml Nml Nml Nml Nml Nml Nml Nml Nml Nml Nml N/A  Left Biceps Nml Nml Nml Nml Nml Nml Nml Nml Nml Nml Nml Nml N/A  Left Triceps Nml Nml Nml Nml Nml Nml Nml Nml Nml Nml Nml Nml N/A  Left Deltoid Nml Nml Nml Nml Nml Nml Nml Nml Nml Nml Nml Nml N/A      Waveforms:

## 2018-05-03 DIAGNOSIS — G5603 Carpal tunnel syndrome, bilateral upper limbs: Secondary | ICD-10-CM | POA: Diagnosis not present

## 2018-05-05 ENCOUNTER — Ambulatory Visit: Payer: Federal, State, Local not specified - PPO

## 2018-05-18 ENCOUNTER — Ambulatory Visit
Admission: RE | Admit: 2018-05-18 | Discharge: 2018-05-18 | Disposition: A | Discharge status: Home / Self Care | Payer: Federal, State, Local not specified - PPO | Source: Ambulatory Visit | Attending: Certified Nurse Midwife | Admitting: Certified Nurse Midwife

## 2018-05-18 DIAGNOSIS — Z1231 Encounter for screening mammogram for malignant neoplasm of breast: Secondary | ICD-10-CM

## 2018-05-24 ENCOUNTER — Other Ambulatory Visit: Payer: Self-pay | Admitting: Certified Nurse Midwife

## 2018-05-24 DIAGNOSIS — N6310 Unspecified lump in the right breast, unspecified quadrant: Secondary | ICD-10-CM

## 2018-05-26 ENCOUNTER — Telehealth: Payer: Self-pay | Admitting: Certified Nurse Midwife

## 2018-05-26 NOTE — Telephone Encounter (Signed)
Spoke with patient. Patient reports no new breast lumps or breast changes. Patient states she was contacted for additional imaging "ordered by her doctor".   Advised patient radiologist at [facility] is going to review images, and is to return call to advise on f/u, additional imaging may still be recommended. [facility] should also be contacting you directly. Questions answered, patient thankful for return call.

## 2018-05-26 NOTE — Telephone Encounter (Addendum)
Reviewed 05/18/18 MMG in Epic, report not completed. Patient last seen in office on 01/20/18. Last Dx MMG and [place] right breast 09/13/17 for right breast mass, bilateral screening in 3 months to resume annual schedule.   Call placed to [facility], spoke with Hannah Beat. Was advised she is going to have radiologist look at images, she is unclear if patient reported new breast lump or was referring to existing lump noted on previous imaging. Juliann Pulse will confirm this and return call, patient may still need f/u imaging. Was advised [facility] will contact patient as well.

## 2018-05-26 NOTE — Telephone Encounter (Addendum)
Reviewed with Melvia Heaps, CNM. Last AEX 01/20/18, no new breast lumps noted.    Call returned to [facility], spoke with Dr. Shelly Bombard, advised no new breast lumps, pain or skin changes per conversation with patient and Melvia Heaps, CNM. Dr. Shelly Bombard has reviewed MMG, she will contact patient directly to discuss MMG results and recommendations.

## 2018-05-26 NOTE — Telephone Encounter (Signed)
Patient called stating that she received a call from [facility] to schedule an ultrasound of her right breast. Patient stated that she had no idea about this and when she asked follow up questions, she was told to call our office. Patient stated that she had a mammogram on 05/18/18.

## 2018-05-27 NOTE — Telephone Encounter (Signed)
-----   Message from Regina Eck, CNM sent at 05/27/2018  7:37 AM EST ----- Reviewed mammogram birads 1 negative Density b Repeat yearly with 3 D as you did this year

## 2018-05-27 NOTE — Telephone Encounter (Signed)
Spoke with patient, advised as seen below per Melvia Heaps, CNM. Patient did speak with radiologist yesterday afternoon, questions answered. Patient verbalizes understanding and is agreeable.   Routing to provider for final review. Patient is agreeable to disposition. Will close encounter.

## 2018-09-05 ENCOUNTER — Ambulatory Visit: Payer: Federal, State, Local not specified - PPO | Admitting: Family Medicine

## 2018-09-05 ENCOUNTER — Encounter: Payer: Self-pay | Admitting: Family Medicine

## 2018-09-05 ENCOUNTER — Other Ambulatory Visit: Payer: Self-pay

## 2018-09-05 VITALS — BP 118/82 | HR 91 | Temp 98.9°F | Ht 65.0 in | Wt 206.8 lb

## 2018-09-05 DIAGNOSIS — J301 Allergic rhinitis due to pollen: Secondary | ICD-10-CM

## 2018-09-05 NOTE — Assessment & Plan Note (Signed)
Symptoms seem more c/w allergies as opposed to a viral origin. Looks well on exam. Reports fever but is afebrile here and has not taken any antipyretics. -Counseled on supportive care. -She will send MyChart message on a daily basis.  If she develops a fever then she will follow-up.  We will send her the testing to have coronavirus testing at [facility]. -Provided work note

## 2018-09-05 NOTE — Progress Notes (Signed)
Jordan [name] - 43 y.o. female MRN [id number]  Date of birth: [birth year]  SUBJECTIVE:  Including CC & ROS.  Chief Complaint  Patient presents with  . Headache    headache, runny nose/stuffy, cough with mucous, sore throat, fatigues, pt states she was carrying something up stairs and thought she was going to pass out/ fever of 101.6 at home/ 4 days    Jordan [name] is a 43 y.o. female that is  Presenting with cough, runny nose and malaise. Her symptoms start 4 days ago. Works at the [employer]. Reports a fever of 101.6 was documented at 1 pm today. No exposure to anyone with similar symptoms. No travel.    Review of Systems  Constitutional: Positive for fever.  HENT: Positive for congestion and rhinorrhea.   Respiratory: Positive for cough.   Cardiovascular: Negative for chest pain.  Gastrointestinal: Negative for abdominal pain.  Musculoskeletal: Negative for back pain.  Neurological: Negative for weakness.  Hematological: Negative for adenopathy.  Psychiatric/Behavioral: Negative for agitation.    HISTORY: Past Medical, Surgical, Social, and Family History Reviewed & Updated per EMR.   Pertinent Historical Findings include:  Past Medical History:  Diagnosis Date  . Complication of anesthesia 2009   failed epidural, severe pain from second attempt  . HSV-2 infection   . Hypothyroid    graves    Past Surgical History:  Procedure Laterality Date  . CESAREAN SECTION    . DILATION AND CURETTAGE OF UTERUS  1995   TAB  . HYSTEROSCOPY  03/21/2012   Procedure: HYSTEROSCOPY;  Surgeon: Marvene Staff, MD;  Location: [facility];  Service: Gynecology;;  . IUD REMOVAL  03/21/2012   Procedure: INTRAUTERINE DEVICE (IUD) REMOVAL;  Surgeon: Marvene Staff, MD;  Location: [facility];  Service: Gynecology;  Laterality: N/A;  . LAPAROSCOPIC TUBAL LIGATION  03/21/2012   Procedure: LAPAROSCOPIC TUBAL LIGATION;  Surgeon: Marvene Staff, MD;  Location: [facility];  Service:  Gynecology;  Laterality: Bilateral;  with cautery  . SUPERFICIAL LYMPH NODE BIOPSY / EXCISION    . TUBAL LIGATION    . UTERINE SUSPENSION  2007    No Known Allergies  Family History  Problem Relation Age of Onset  . Hypertension Mother   . Allergies Mother   . Rheum arthritis Mother   . Hypertension Father   . Allergies Father   . Breast cancer Paternal Aunt   . Thyroid disease Paternal Aunt   . Breast cancer Maternal Aunt   . Thyroid disease Brother   . Stroke Maternal Grandmother   . Stroke Paternal Grandmother   . Thyroid disease Paternal Grandmother   . Thyroid disease Paternal Aunt      Social History   Socioeconomic History  . Marital status: Married    Spouse name: Not on file  . Number of children: Not on file  . Years of education: Not on file  . Highest education level: Not on file  Occupational History  . Not on file  Social Needs  . Financial resource strain: Not on file  . Food insecurity:    Worry: Not on file    Inability: Not on file  . Transportation needs:    Medical: Not on file    Non-medical: Not on file  Tobacco Use  . Smoking status: Never Smoker  . Smokeless tobacco: Never Used  Substance and Sexual Activity  . Alcohol use: No    Alcohol/week: 0.0 standard drinks    Comment: once every  2 mths  . Drug use: No  . Sexual activity: Yes    Partners: Male    Birth control/protection: Surgical    Comment: tubal ligation  Lifestyle  . Physical activity:    Days per week: Not on file    Minutes per session: Not on file  . Stress: Not on file  Relationships  . Social connections:    Talks on phone: Not on file    Gets together: Not on file    Attends religious service: Not on file    Active member of club or organization: Not on file    Attends meetings of clubs or organizations: Not on file    Relationship status: Not on file  . Intimate partner violence:    Fear of current or ex partner: Not on file    Emotionally abused: Not on  file    Physically abused: Not on file    Forced sexual activity: Not on file  Other Topics Concern  . Not on file  Social History Narrative  . Not on file     PHYSICAL EXAM:  VS: BP 118/82   Pulse 91   Temp 98.9 F (37.2 C) (Oral)   Ht 5\' 5"  (1.651 m)   Wt 206 lb 12.8 oz (93.8 kg)   SpO2 98%   BMI 34.41 kg/m  Physical Exam Gen: NAD, alert, cooperative with exam, well-appearing ENT: normal lips, normal nasal mucosa, Eye: normal EOM, normal conjunctiva and lids CV:  no edema, +2 pedal pulses, regular rate and rhythm, S1-S2   Resp: no accessory muscle use, non-labored, clear to auscultation bilaterally, no crackles or wheezes Skin: no rashes, no areas of induration  Neuro: normal tone, normal sensation to touch Psych:  normal insight, alert and oriented MSK: Normal gait, normal strength       ASSESSMENT & PLAN:   Seasonal allergic rhinitis due to pollen Symptoms seem more c/w allergies as opposed to a viral origin. Looks well on exam. Reports fever but is afebrile here and has not taken any antipyretics. -Counseled on supportive care. -She will send MyChart message on a daily basis.  If she develops a fever then she will follow-up.  We will send her the testing to have coronavirus testing at [facility]. -Provided work note

## 2018-09-05 NOTE — Patient Instructions (Signed)
Nice to meet you  Please wear the mask  Please send me a message in [place]. [facility] will have the drive through open tomorrow and I will send you the details.  Please let me know if you have a fever.  Please follow up if your symptoms change or worsen.

## 2018-09-06 ENCOUNTER — Encounter: Payer: Self-pay | Admitting: Family Medicine

## 2018-09-07 ENCOUNTER — Encounter: Payer: Self-pay | Admitting: Family Medicine

## 2018-10-06 ENCOUNTER — Telehealth: Payer: Self-pay | Admitting: Nurse Practitioner

## 2018-10-06 NOTE — Telephone Encounter (Signed)
Called patient because it has been a while since last appointment, I let her know about virtual visits and she said she will call if she needs appt.

## 2018-11-29 ENCOUNTER — Telehealth: Payer: Self-pay | Admitting: Certified Nurse Midwife

## 2018-11-29 NOTE — Telephone Encounter (Signed)
Left message regarding upcoming appointment has been canceled and needs to be rescheduled. °

## 2018-12-12 ENCOUNTER — Other Ambulatory Visit: Payer: Self-pay | Admitting: *Deleted

## 2018-12-12 DIAGNOSIS — Z20822 Contact with and (suspected) exposure to covid-19: Secondary | ICD-10-CM

## 2018-12-14 DIAGNOSIS — S9031XA Contusion of right foot, initial encounter: Secondary | ICD-10-CM | POA: Diagnosis not present

## 2018-12-19 LAB — NOVEL CORONAVIRUS, NAA: SARS-CoV-2, NAA: NOT DETECTED

## 2019-01-26 ENCOUNTER — Ambulatory Visit: Payer: Federal, State, Local not specified - PPO | Admitting: Certified Nurse Midwife

## 2019-01-27 ENCOUNTER — Other Ambulatory Visit: Payer: Self-pay | Admitting: Certified Nurse Midwife

## 2019-01-27 DIAGNOSIS — Z8619 Personal history of other infectious and parasitic diseases: Secondary | ICD-10-CM

## 2019-01-30 NOTE — Telephone Encounter (Signed)
Refill x one only, needs to schedule aex

## 2019-01-30 NOTE — Telephone Encounter (Signed)
Medication refill request: Valtrex Last AEX:  01/20/18 DL Next AEX: none scheduled -- tried calling patient, no answer and unable to leave VM Last MMG (if hormonal medication request): 05/18/18 BIRADS 1 negative/density b Refill authorized: Please advise on refill

## 2019-03-19 DIAGNOSIS — Z23 Encounter for immunization: Secondary | ICD-10-CM | POA: Diagnosis not present

## 2019-03-21 ENCOUNTER — Other Ambulatory Visit: Payer: Self-pay

## 2019-03-23 ENCOUNTER — Ambulatory Visit: Payer: Federal, State, Local not specified - PPO | Admitting: Internal Medicine

## 2019-03-23 ENCOUNTER — Other Ambulatory Visit: Payer: Self-pay

## 2019-03-23 ENCOUNTER — Encounter: Payer: Self-pay | Admitting: Internal Medicine

## 2019-03-23 VITALS — BP 120/72 | HR 92 | Ht 65.0 in | Wt 207.0 lb

## 2019-03-23 DIAGNOSIS — E890 Postprocedural hypothyroidism: Secondary | ICD-10-CM

## 2019-03-23 NOTE — Patient Instructions (Signed)
Please stop at the lab.  Please continue Synthroid 175 mcg daily.  Take the thyroid hormone every day, with water, at least 30 minutes before breakfast, separated by at least 4 hours from: - acid reflux medications - calcium - iron - multivitamins  Please return in 1 year.

## 2019-03-23 NOTE — Progress Notes (Signed)
Subjective:     Patient ID: Jordan [name], female   DOB: [birth year], 43 y.o.   MRN: [id number]  HPI Jordan [name] is a 43 y.o. woman, returning for followup for hypothyroidism post RAI ablation for Graves' disease in 1997. Last visit 1 year ago.  She started a religious fast: no sugar, meat, starches. Only veggies, fruits, eggs, oatmeal. Lost 8 lbs.  Reviewed her TFTs: Lab Results  Component Value Date   TSH 0.86 03/22/2018   TSH 2.54 02/09/2017   TSH 3.28 08/20/2015   TSH 0.85 02/18/2015   TSH 0.36 10/18/2014   TSH 40.089 (H) 09/13/2014   FREET4 1.08 03/22/2018   FREET4 1.01 02/09/2017   FREET4 0.94 08/20/2015   FREET4 0.90 02/18/2015   FREET4 0.91 10/18/2014   FREET4 1.01 03/03/2013   Pt is on Synthroid 175 mcg daily, taken: - in am - fasting - at least 30 min from b'fast - no Ca, Fe, PPIs - + Multivitamins at night - not on Biotin  Pt denies: - feeling nodules in neck - hoarseness - dysphagia - choking - SOB with lying down  No FH of thyroid cancer. No h/o radiation tx to head or neck.  No herbal supplements. No Biotin use. No recent steroids use.   She used to do [employer] in the past but stopped after developed bronchitis last year.  Review of Systems Constitutional: no weight gain/+ weight loss, no fatigue, no subjective hyperthermia, no subjective hypothermia Eyes: no blurry vision, no xerophthalmia ENT: no sore throat, + see HPI Cardiovascular: no CP/no SOB/no palpitations/no leg swelling Respiratory: no cough/no SOB/no wheezing Gastrointestinal: no N/no V/no D/no C/no acid reflux Musculoskeletal: no muscle aches/no joint aches Skin: no rashes, no hair loss Neurological: no tremors/no numbness/no tingling/no dizziness  I reviewed pt's medications, allergies, PMH, social hx, family hx, and changes were documented in the history of present illness. Otherwise, unchanged from my initial visit note.  Past Medical History:  Diagnosis Date  . Complication  of anesthesia 2009   failed epidural, severe pain from second attempt  . HSV-2 infection   . Hypothyroid    graves   Past Surgical History:  Procedure Laterality Date  . CESAREAN SECTION    . DILATION AND CURETTAGE OF UTERUS  1995   TAB  . HYSTEROSCOPY  03/21/2012   Procedure: HYSTEROSCOPY;  Surgeon: Marvene Staff, MD;  Location: [facility];  Service: Gynecology;;  . IUD REMOVAL  03/21/2012   Procedure: INTRAUTERINE DEVICE (IUD) REMOVAL;  Surgeon: Marvene Staff, MD;  Location: [facility];  Service: Gynecology;  Laterality: N/A;  . LAPAROSCOPIC TUBAL LIGATION  03/21/2012   Procedure: LAPAROSCOPIC TUBAL LIGATION;  Surgeon: Marvene Staff, MD;  Location: [facility];  Service: Gynecology;  Laterality: Bilateral;  with cautery  . SUPERFICIAL LYMPH NODE BIOPSY / EXCISION    . TUBAL LIGATION    . UTERINE SUSPENSION  2007   Social History   Socioeconomic History  . Marital status: Married    Spouse name: Not on file  . Number of children: Not on file  . Years of education: Not on file  . Highest education level: Not on file  Occupational History  . Not on file  Social Needs  . Financial resource strain: Not on file  . Food insecurity    Worry: Not on file    Inability: Not on file  . Transportation needs    Medical: Not on file    Non-medical: Not on file  Tobacco Use  . Smoking status: Never Smoker  . Smokeless tobacco: Never Used  Substance and Sexual Activity  . Alcohol use: No    Alcohol/week: 0.0 standard drinks    Comment: once every 2 mths  . Drug use: No  . Sexual activity: Yes    Partners: Male    Birth control/protection: Surgical    Comment: tubal ligation  Lifestyle  . Physical activity    Days per week: Not on file    Minutes per session: Not on file  . Stress: Not on file  Relationships  . Social Herbalist on phone: Not on file    Gets together: Not on file    Attends religious service: Not on file    Active member of club or  organization: Not on file    Attends meetings of clubs or organizations: Not on file    Relationship status: Not on file  . Intimate partner violence    Fear of current or ex partner: Not on file    Emotionally abused: Not on file    Physically abused: Not on file    Forced sexual activity: Not on file  Other Topics Concern  . Not on file  Social History Narrative  . Not on file   Current Outpatient Medications on File Prior to Visit  Medication Sig Dispense Refill  . cetirizine (ZYRTEC) 10 MG tablet Take 10 mg by mouth daily.    . meloxicam (MOBIC) 15 MG tablet     . Multiple Vitamins-Minerals (MULTIVITAMIN PO) Take by mouth.    . pantoprazole (PROTONIX) 40 MG tablet TAKE 1 TABLET (40 MG TOTAL) BY MOUTH DAILY. TAKE 30-60 MIN BEFORE FIRST MEAL OF THE DAY (Patient taking differently: Take 40 mg by mouth as needed. Take 30-60 min before first meal of the day) 30 tablet 2  . SYNTHROID 175 MCG tablet Take 1 tablet (175 mcg total) by mouth daily before breakfast. 90 tablet 3  . valACYclovir (VALTREX) 1000 MG tablet Take one tablet twice daily for 3 days at onset of outbreak 30 tablet 0   No current facility-administered medications on file prior to visit.    No Known Allergies Family History  Problem Relation Age of Onset  . Hypertension Mother   . Allergies Mother   . Rheum arthritis Mother   . Hypertension Father   . Allergies Father   . Breast cancer Paternal Aunt   . Thyroid disease Paternal Aunt   . Breast cancer Maternal Aunt   . Thyroid disease Brother   . Stroke Maternal Grandmother   . Stroke Paternal Grandmother   . Thyroid disease Paternal Grandmother   . Thyroid disease Paternal Aunt     Objective:   Physical Exam BP 120/72   Pulse 92   Ht 5\' 5"  (1.651 m)   Wt 207 lb (93.9 kg)   SpO2 98%   BMI 34.45 kg/m  Body mass index is 34.45 kg/m. Wt Readings from Last 3 Encounters:  03/23/19 207 lb (93.9 kg)  09/05/18 206 lb 12.8 oz (93.8 kg)  03/22/18 203 lb (92.1  kg)   Constitutional: overweight, in NAD Eyes: PERRLA, EOMI, no exophthalmos ENT: moist mucous membranes, no thyromegaly, no cervical lymphadenopathy Cardiovascular: RRR, No MRG Respiratory: CTA B Gastrointestinal: abdomen soft, NT, ND, BS+ Musculoskeletal: no deformities, strength intact in all 4 Skin: moist, warm, no rashes Neurological: no tremor with outstretched hands, DTR normal in all 4  Assessment:     1.  Iatrogenic hypothyroidism - post ablation for Graves' disease in 1997     Plan:     Pt with longstanding, post ablative hypothyroidism, previously with variable control due to noncompliance with her levothyroxine, but now with normal thyroid test after she started to take the medication as prescribed. - latest thyroid labs reviewed with pt >> normal  - she continues on Synthroid d.a.w. 175 mcg daily - pt feels good on this dose, without complaints. She lost weight recently  - fasting. Congratulated her. - no signs of Graves' ophthalmopathy: She denies blurry vision, double vision, chemosis, exophthalmos - we discussed about taking the thyroid hormone every day, with water, >30 minutes before breakfast, separated by >4 hours from acid reflux medications, calcium, iron, multivitamins. Pt. is taking it correctly. - will check thyroid tests today: TSH and fT4 - If labs are abnormal, she will need to return for repeat TFTs in 1.5 months  She needs refills.  - time spent with the patient: 15 minutes, of which >50% was spent in obtaining information about her symptoms, reviewing her previous labs, evaluations, and treatments, counseling her about her condition (please see the discussed topics above), and developing a plan to further investigate and treat it; she had a number of questions which I addressed.  Component     Latest Ref Rng & Units 03/23/2019  TSH     0.35 - 4.50 uIU/mL 1.21  T4,Free(Direct)     0.60 - 1.60 ng/dL 1.08  Thyroid tests are perfect.  We will refill the  same dose of Synthroid.  Philemon Kingdom, MD PhD [facility] Endocrinology

## 2019-03-24 LAB — TSH: TSH: 1.21 u[IU]/mL (ref 0.35–4.50)

## 2019-03-24 LAB — T4, FREE: Free T4: 1.08 ng/dL (ref 0.60–1.60)

## 2019-03-24 MED ORDER — SYNTHROID 175 MCG PO TABS: 175.0000 ug | ORAL_TABLET | Freq: Every day | ORAL | 3 refills | Status: DC

## 2019-03-27 NOTE — Progress Notes (Signed)
Please contact patient to schedule CPE (fasting) in order to maintain relationship as her pcp

## 2019-03-29 NOTE — Progress Notes (Signed)
Pt schedule for CPE 05/03/2019

## 2019-05-02 ENCOUNTER — Telehealth: Payer: Self-pay | Admitting: Nurse Practitioner

## 2019-05-02 NOTE — Telephone Encounter (Signed)

## 2019-05-03 ENCOUNTER — Encounter: Payer: Self-pay | Admitting: Nurse Practitioner

## 2019-05-03 ENCOUNTER — Ambulatory Visit (INDEPENDENT_AMBULATORY_CARE_PROVIDER_SITE_OTHER): Payer: Federal, State, Local not specified - PPO | Admitting: Nurse Practitioner

## 2019-05-03 ENCOUNTER — Other Ambulatory Visit: Payer: Self-pay

## 2019-05-03 VITALS — BP 126/86 | HR 87 | Temp 97.2°F | Ht 65.0 in | Wt 214.0 lb

## 2019-05-03 DIAGNOSIS — Z0001 Encounter for general adult medical examination with abnormal findings: Secondary | ICD-10-CM

## 2019-05-03 DIAGNOSIS — E559 Vitamin D deficiency, unspecified: Secondary | ICD-10-CM

## 2019-05-03 DIAGNOSIS — E782 Mixed hyperlipidemia: Secondary | ICD-10-CM | POA: Diagnosis not present

## 2019-05-03 DIAGNOSIS — D500 Iron deficiency anemia secondary to blood loss (chronic): Secondary | ICD-10-CM

## 2019-05-03 LAB — CBC WITH DIFFERENTIAL/PLATELET
Basophils Absolute: 0.1 10*3/uL (ref 0.0–0.1)
Basophils Relative: 1 % (ref 0.0–3.0)
Eosinophils Absolute: 0.1 10*3/uL (ref 0.0–0.7)
Eosinophils Relative: 1.6 % (ref 0.0–5.0)
HCT: 34.4 % — ABNORMAL LOW (ref 36.0–46.0)
Hemoglobin: 11.3 g/dL — ABNORMAL LOW (ref 12.0–15.0)
Lymphocytes Relative: 28 % (ref 12.0–46.0)
Lymphs Abs: 1.8 10*3/uL (ref 0.7–4.0)
MCHC: 32.9 g/dL (ref 30.0–36.0)
MCV: 86.6 fl (ref 78.0–100.0)
Monocytes Absolute: 0.5 10*3/uL (ref 0.1–1.0)
Monocytes Relative: 7.2 % (ref 3.0–12.0)
Neutro Abs: 3.9 10*3/uL (ref 1.4–7.7)
Neutrophils Relative %: 62.2 % (ref 43.0–77.0)
Platelets: 298 10*3/uL (ref 150.0–400.0)
RBC: 3.97 Mil/uL (ref 3.87–5.11)
RDW: 13.2 % (ref 11.5–15.5)
WBC: 6.3 10*3/uL (ref 4.0–10.5)

## 2019-05-03 LAB — COMPREHENSIVE METABOLIC PANEL
ALT: 13 U/L (ref 0–35)
AST: 18 U/L (ref 0–37)
Albumin: 4 g/dL (ref 3.5–5.2)
Alkaline Phosphatase: 49 U/L (ref 39–117)
BUN: 11 mg/dL (ref 6–23)
CO2: 27 mEq/L (ref 19–32)
Calcium: 8.8 mg/dL (ref 8.4–10.5)
Chloride: 105 mEq/L (ref 96–112)
Creatinine, Ser: 0.85 mg/dL (ref 0.40–1.20)
GFR: 88.02 mL/min (ref >60.00)
Glucose, Bld: 95 mg/dL (ref 70–99)
Potassium: 4 mEq/L (ref 3.5–5.1)
Sodium: 137 mEq/L (ref 135–145)
Total Bilirubin: 0.4 mg/dL (ref 0.2–1.2)
Total Protein: 7 g/dL (ref 6.0–8.3)

## 2019-05-03 LAB — LIPID PANEL
Cholesterol: 274 mg/dL — ABNORMAL HIGH (ref 0–200)
HDL: 69.3 mg/dL (ref >39.00)
LDL Cholesterol: 189 mg/dL — ABNORMAL HIGH (ref 0–99)
NonHDL: 205.15
Total CHOL/HDL Ratio: 4
Triglycerides: 81 mg/dL (ref 0.0–149.0)
VLDL: 16.2 mg/dL (ref 0.0–40.0)

## 2019-05-03 NOTE — Progress Notes (Signed)
Subjective:    Patient ID: Jordan [name], female    DOB: 07-Sep-1975, 43 y.o.   MRN: [id number]  Patient presents today for complete physical   HPI  Hypothyroidism: Managed by Dr. Letta Median (endocrinology)  Sexual History (orientation,birth control, marital status, STD):married, sexually active, has regular but heavy menstrual cycle/no clots, up to date with PAP and breast exam by GYN: Melvia Heaps FHx of breast cancer: aunts at age 46. She get annual mammograms, last done 04/2018 (normal). Upcoming appt next month.  Depression/Suicide: Depression screen [facility] 2/9 05/03/2019 02/19/2017 04/08/2016  Decreased Interest 0 0 0  Down, Depressed, Hopeless 0 0 0  PHQ - 2 Score 0 0 0   Vision:up to date  Dental:up to date  Immunizations: (TDAP, Hep C screen, Pneumovax, Influenza, zoster)  Health Maintenance  Topic Date Due  . Tetanus Vaccine  07/05/2017  . HIV Screening  05/02/2020*  . Pap Smear  01/20/2021  . Flu Shot  Completed  *Topic was postponed. The date shown is not the original due date.   Diet:regular  Weight:  Wt Readings from Last 3 Encounters:  05/03/19 214 lb (97.1 kg)  03/23/19 207 lb (93.9 kg)  09/05/18 206 lb 12.8 oz (93.8 kg)    Exercise:none  Fall Risk: Fall Risk  05/03/2019 02/19/2017 04/08/2016  Falls in the past year? 0 No No   Advanced Directive: Advanced Directives 05/16/2017  Does Patient Have a Medical Advance Directive? No  Would patient like information on creating a medical advance directive? No - Patient declined    Medications and allergies reviewed with patient and updated if appropriate.  Patient Active Problem List   Diagnosis Date Noted  . Iron deficiency anemia due to chronic blood loss 05/04/2019  . Seasonal allergic rhinitis due to pollen 09/05/2018  . 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 Fatigue 04/08/2016  . Hip pain, acute, right 04/08/2016  . Vitamin D deficiency 04/08/2016  . Hyperlipidemia  04/08/2016  . 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 cetirizine (ZYRTEC) 10 MG tablet Take 10 mg by mouth daily.    . Multiple Vitamins-Minerals (MULTIVITAMIN PO) Take by mouth.    . SYNTHROID 175 MCG tablet Take 1 tablet (175 mcg total) by mouth daily before breakfast. 90 tablet 3  . valACYclovir (VALTREX) 1000 MG tablet Take one tablet twice daily for 3 days at onset of outbreak 30 tablet 0  . meloxicam (MOBIC) 15 MG tablet     . pantoprazole (PROTONIX) 40 MG tablet TAKE 1 TABLET (40 MG TOTAL) BY MOUTH DAILY. TAKE 30-60 MIN BEFORE FIRST MEAL OF THE DAY (Patient not taking: Reported on 05/03/2019) 30 tablet 2   No current facility-administered medications on file prior to visit.     Past Medical History:  Diagnosis Date  . Complication of anesthesia 2009   failed epidural, severe pain from second attempt  . HSV-2 infection   . Hypothyroid    graves    Past Surgical History:  Procedure Laterality Date  . CESAREAN SECTION    . DILATION AND CURETTAGE OF UTERUS  1995   TAB  . HYSTEROSCOPY  03/21/2012   Procedure: HYSTEROSCOPY;  Surgeon: Marvene Staff, MD;  Location: [facility];  Service: Gynecology;;  . IUD REMOVAL  03/21/2012   Procedure: INTRAUTERINE DEVICE (IUD) REMOVAL;  Surgeon: Marvene Staff, MD;  Location: [facility];  Service: Gynecology;  Laterality: N/A;  . LAPAROSCOPIC TUBAL LIGATION  03/21/2012   Procedure: LAPAROSCOPIC TUBAL LIGATION;  Surgeon: Marvene Staff, MD;  Location: [facility];  Service: Gynecology;  Laterality: Bilateral;  with cautery  . SUPERFICIAL LYMPH NODE BIOPSY / EXCISION    . TUBAL LIGATION    . UTERINE SUSPENSION  2007    Social History   Socioeconomic History  . Marital status: Married    Spouse name: Not on file  . Number of children: 2  . Years of education: Not on file  . Highest education level: Not on file  Occupational History  . Not on  file  Social Needs  . Financial resource strain: Not on file  . Food insecurity    Worry: Not on file    Inability: Not on file  . Transportation needs    Medical: Not on file    Non-medical: Not on file  Tobacco Use  . Smoking status: Never Smoker  . Smokeless tobacco: Never Used  Substance and Sexual Activity  . Alcohol use: No    Alcohol/week: 0.0 standard drinks    Comment: once every 2 mths  . Drug use: No  . Sexual activity: Yes    Partners: Male    Birth control/protection: Surgical    Comment: tubal ligation  Lifestyle  . Physical activity    Days per week: Not on file    Minutes per session: Not on file  . Stress: Not on file  Relationships  . Social Herbalist on phone: Not on file    Gets together: Not on file    Attends religious service: Not on file    Active member of club or organization: Not on file    Attends meetings of clubs or organizations: Not on file    Relationship status: Not on file  Other Topics Concern  . Not on file  Social History Narrative  . Not on file    Family History  Problem Relation Age of Onset  . Hypertension Mother   . Allergies Mother   . Rheum arthritis Mother   . Hypertension Father   . Allergies Father   . Breast cancer Paternal Aunt 71  . Thyroid disease Paternal Aunt   . Breast cancer Maternal Aunt 19  . Thyroid disease Brother   . Stroke Maternal Grandmother   . Stroke Paternal Grandmother   . Thyroid disease Paternal Grandmother   . Thyroid disease Paternal Aunt         Review of Systems  Constitutional: Negative for fever, malaise/fatigue and weight loss.  HENT: Negative for congestion and sore throat.   Eyes:       Negative for visual changes  Respiratory: Negative for cough and shortness of breath.   Cardiovascular: Negative for chest pain, palpitations and leg swelling.  Gastrointestinal: Negative for abdominal pain, blood in stool, constipation, diarrhea and heartburn.  Genitourinary:  Negative for dysuria, frequency and urgency.  Musculoskeletal: Negative for falls, joint pain and myalgias.  Skin: Negative for rash.  Neurological: Negative for dizziness, sensory change and headaches.  Endo/Heme/Allergies: Does not bruise/bleed easily.  Psychiatric/Behavioral: Negative for depression, substance abuse and suicidal ideas. The patient is not nervous/anxious and does not have insomnia.     Objective:   Vitals:   05/03/19 0850  BP: 126/86  Pulse: 87  Temp: (!) 97.2 F (36.2 C)  SpO2: 97%    Body mass index is 35.61 kg/m.   Physical Examination:  Physical Exam  Vitals signs reviewed.  Constitutional:      General: She is not in acute distress.    Appearance: She is well-developed. She is obese.  HENT:     Head: Normocephalic.     Right Ear: Tympanic membrane, ear canal and external ear normal.     Left Ear: Tympanic membrane, ear canal and external ear normal.  Eyes:     Conjunctiva/sclera: Conjunctivae normal.     Pupils: Pupils are equal, round, and reactive to light.  Neck:     Musculoskeletal: Normal range of motion and neck supple.  Cardiovascular:     Rate and Rhythm: Normal rate and regular rhythm.     Pulses: Normal pulses.     Heart sounds: Normal heart sounds.  Pulmonary:     Effort: Pulmonary effort is normal. No respiratory distress.     Breath sounds: Normal breath sounds.  Chest:     Chest wall: No tenderness.  Abdominal:     General: Bowel sounds are normal. There is no distension.     Palpations: Abdomen is soft.     Tenderness: There is no abdominal tenderness.  Genitourinary:    Comments: Deferred breast and pelvic exam to GYN per patient Musculoskeletal: Normal range of motion.     Right lower leg: No edema.     Left lower leg: No edema.  Lymphadenopathy:     Cervical: No cervical adenopathy.  Skin:    General: Skin is warm and dry.     Findings: No erythema or rash.  Neurological:     Mental Status: She is alert and  oriented to person, place, and time.     Deep Tendon Reflexes: Reflexes are normal and symmetric.  Psychiatric:        Mood and Affect: Mood normal.        Behavior: Behavior normal.        Thought Content: Thought content normal.        Judgment: Judgment normal.    ASSESSMENT and PLAN:  Jaira was seen today for annual exam.  Diagnoses and all orders for this visit:  Encounter for preventative adult health care exam with abnormal findings -     CMP  Mixed hyperlipidemia -     HTN_4 Lipid panel  Vitamin D deficiency -     Cancel: Vitamin D 1,25 dihydroxy -     Vitamin D 1,25 dihydroxy  Iron deficiency anemia due to chronic blood loss -     Iron, TIBC and Ferritin Panel -     CBC w/Diff   Iron deficiency anemia due to chronic blood loss Cbc and iron panel indicates mild iron deficiency. Continue multivitamin with iron.  Hyperlipidemia Abnormal lipid panel: persistent elevated TC and LDL. In absence of HTN and DM, you have low risk to develop cardiovascular disease over the next 5yrs.  I still encourage heart healthy diet and regular exercise to improve these numbers.  Lipid Panel     Component Value Date/Time   CHOL 274 (H) 05/03/2019 0924   TRIG 81.0 05/03/2019 0924   HDL 69.30 05/03/2019 0924   CHOLHDL 4 05/03/2019 0924   VLDL 16.2 05/03/2019 0924   LDLCALC 189 (H) 05/03/2019 0924      Problem List Items Addressed This Visit      Other   Hyperlipidemia    Abnormal lipid panel: persistent elevated TC and LDL. In absence of HTN and DM, you have low risk to develop cardiovascular disease over the next 21yrs.  I still encourage heart healthy diet and regular exercise to improve these numbers.  Lipid Panel     Component Value Date/Time   CHOL 274 (H) 05/03/2019 0924   TRIG 81.0 05/03/2019 0924   HDL 69.30 05/03/2019 0924   CHOLHDL 4 05/03/2019 0924   VLDL 16.2 05/03/2019 0924   LDLCALC 189 (H) 05/03/2019 0924        Relevant Orders   HTN_4 Lipid panel  (Completed)   Iron deficiency anemia due to chronic blood loss    Cbc and iron panel indicates mild iron deficiency. Continue multivitamin with iron.      Relevant Orders   Iron, TIBC and Ferritin Panel (Completed)   CBC w/Diff (Completed)   Vitamin D deficiency   Relevant Orders   Vitamin D 1,25 dihydroxy    Other Visit Diagnoses    Encounter for preventative adult health care exam with abnormal findings    -  Primary   Relevant Orders   CMP (Completed)      Follow up: Return in about 1 year (around 05/02/2020) for CPE (fasting).  Wilfred Lacy, NP

## 2019-05-03 NOTE — Patient Instructions (Addendum)
Normal renal and liver function  Cbc and iron panel indicates mild iron deficiency. Continue multivitamin with iron.  Abnormal lipid panel: persistent elevated TC and LDL. In absence of HTN and DM, you have low risk to develop cardiovascular disease over the next 76yrs.  I still encourage heart healthy diet and regular exercise to improve these numbers.  Pending vitamin D  Start daily exercise and heart healthy diet  Have mammogram result faxed to me.  Health Maintenance, Female Adopting a healthy lifestyle and getting preventive care are important in promoting health and wellness. Ask your health care provider about:  The right schedule for you to have regular tests and exams.  Things you can do on your own to prevent diseases and keep yourself healthy. What should I know about diet, weight, and exercise? Eat a healthy diet   Eat a diet that includes plenty of vegetables, fruits, low-fat dairy products, and lean protein.  Do not eat a lot of foods that are high in solid fats, added sugars, or sodium. Maintain a healthy weight Body mass index (BMI) is used to identify weight problems. It estimates body fat based on height and weight. Your health care provider can help determine your BMI and help you achieve or maintain a healthy weight. Get regular exercise Get regular exercise. This is one of the most important things you can do for your health. Most adults should:  Exercise for at least 150 minutes each week. The exercise should increase your heart rate and make you sweat (moderate-intensity exercise).  Do strengthening exercises at least twice a week. This is in addition to the moderate-intensity exercise.  Spend less time sitting. Even light physical activity can be beneficial. Watch cholesterol and blood lipids Have your blood tested for lipids and cholesterol at 43 years of age, then have this test every 5 years. Have your cholesterol levels checked more often if:  Your  lipid or cholesterol levels are high.  You are older than 43 years of age.  You are at high risk for heart disease. What should I know about cancer screening? Depending on your health history and family history, you may need to have cancer screening at various ages. This may include screening for:  Breast cancer.  Cervical cancer.  Colorectal cancer.  Skin cancer.  Lung cancer. What should I know about heart disease, diabetes, and high blood pressure? Blood pressure and heart disease  High blood pressure causes heart disease and increases the risk of stroke. This is more likely to develop in people who have high blood pressure readings, are of African descent, or are overweight.  Have your blood pressure checked: ? Every 3-5 years if you are 43-63 years of age. ? Every year if you are 86 years old or older. Diabetes Have regular diabetes screenings. This checks your fasting blood sugar level. Have the screening done:  Once every three years after age 14 if you are at a normal weight and have a low risk for diabetes.  More often and at a younger age if you are overweight or have a high risk for diabetes. What should I know about preventing infection? Hepatitis B If you have a higher risk for hepatitis B, you should be screened for this virus. Talk with your health care provider to find out if you are at risk for hepatitis B infection. Hepatitis C Testing is recommended for:  Everyone born from [birth year] through 1965.  Anyone with known risk factors for hepatitis C. Sexually  transmitted infections (STIs)  Get screened for STIs, including gonorrhea and chlamydia, if: ? You are sexually active and are younger than 43 years of age. ? You are older than 43 years of age and your health care provider tells you that you are at risk for this type of infection. ? Your sexual activity has changed since you were last screened, and you are at increased risk for chlamydia or gonorrhea. Ask  your health care provider if you are at risk.  Ask your health care provider about whether you are at high risk for HIV. Your health care provider may recommend a prescription medicine to help prevent HIV infection. If you choose to take medicine to prevent HIV, you should first get tested for HIV. You should then be tested every 3 months for as long as you are taking the medicine. Pregnancy  If you are about to stop having your period (premenopausal) and you may become pregnant, seek counseling before you get pregnant.  Take 400 to 800 micrograms (mcg) of folic acid every day if you become pregnant.  Ask for birth control (contraception) if you want to prevent pregnancy. Osteoporosis and menopause Osteoporosis is a disease in which the bones lose minerals and strength with aging. This can result in bone fractures. If you are 43 years old or older, or if you are at risk for osteoporosis and fractures, ask your health care provider if you should:  Be screened for bone loss.  Take a calcium or vitamin D supplement to lower your risk of fractures.  Be given hormone replacement therapy (HRT) to treat symptoms of menopause. Follow these instructions at home: Lifestyle  Do not use any products that contain nicotine or tobacco, such as cigarettes, e-cigarettes, and chewing tobacco. If you need help quitting, ask your health care provider.  Do not use street drugs.  Do not share needles.  Ask your health care provider for help if you need support or information about quitting drugs. Alcohol use  Do not drink alcohol if: ? Your health care provider tells you not to drink. ? You are pregnant, may be pregnant, or are planning to become pregnant.  If you drink alcohol: ? Limit how much you use to 0-1 drink a day. ? Limit intake if you are breastfeeding.  Be aware of how much alcohol is in your drink. In the [place] one drink equals one 12 oz bottle of beer (355 mL), one 5 oz glass of wine  (148 mL), or one 1 oz glass of hard liquor (44 mL). General instructions  Schedule regular health, dental, and eye exams.  Stay current with your vaccines.  Tell your health care provider if: ? You often feel depressed. ? You have ever been abused or do not feel safe at home. Summary  Adopting a healthy lifestyle and getting preventive care are important in promoting health and wellness.  Follow your health care provider's instructions about healthy diet, exercising, and getting tested or screened for diseases.  Follow your health care provider's instructions on monitoring your cholesterol and blood pressure. This information is not intended to replace advice given to you by your health care provider. Make sure you discuss any questions you have with your health care provider. Document Released: 12/22/2010 Document Revised: 06/01/2018 Document Reviewed: 06/01/2018 Elsevier Patient Education  2020 [employer].

## 2019-05-04 ENCOUNTER — Encounter: Payer: Self-pay | Admitting: Nurse Practitioner

## 2019-05-04 DIAGNOSIS — D500 Iron deficiency anemia secondary to blood loss (chronic): Secondary | ICD-10-CM | POA: Insufficient documentation

## 2019-05-04 NOTE — Assessment & Plan Note (Signed)
Abnormal lipid panel: persistent elevated TC and LDL. In absence of HTN and DM, you have low risk to develop cardiovascular disease over the next 88yrs.  I still encourage heart healthy diet and regular exercise to improve these numbers.  Lipid Panel     Component Value Date/Time   CHOL 274 (H) 05/03/2019 0924   TRIG 81.0 05/03/2019 0924   HDL 69.30 05/03/2019 0924   CHOLHDL 4 05/03/2019 0924   VLDL 16.2 05/03/2019 0924   LDLCALC 189 (H) 05/03/2019 [id number]

## 2019-05-04 NOTE — Assessment & Plan Note (Signed)
Cbc and iron panel indicates mild iron deficiency. Continue multivitamin with iron.

## 2019-05-06 LAB — IRON,TIBC AND FERRITIN PANEL
%SAT: 21 % (calc) (ref 16–45)
Ferritin: 9 ng/mL — ABNORMAL LOW (ref 16–232)
Iron: 69 ug/dL (ref 40–190)
TIBC: 331 mcg/dL (calc) (ref 250–450)

## 2019-05-06 LAB — VITAMIN D 1,25 DIHYDROXY
Vitamin D 1, 25 (OH)2 Total: 75 pg/mL — ABNORMAL HIGH (ref 18–72)
Vitamin D2 1, 25 (OH)2: <8 pg/mL
Vitamin D3 1, 25 (OH)2: 75 pg/mL

## 2019-07-03 ENCOUNTER — Ambulatory Visit: Payer: Federal, State, Local not specified - PPO | Attending: Internal Medicine

## 2019-07-03 DIAGNOSIS — Z20822 Contact with and (suspected) exposure to covid-19: Secondary | ICD-10-CM

## 2019-07-04 LAB — NOVEL CORONAVIRUS, NAA: SARS-CoV-2, NAA: NOT DETECTED

## 2019-07-25 ENCOUNTER — Other Ambulatory Visit: Payer: Self-pay | Admitting: Internal Medicine

## 2019-08-09 ENCOUNTER — Other Ambulatory Visit: Payer: Self-pay | Admitting: Certified Nurse Midwife

## 2019-08-09 DIAGNOSIS — Z1231 Encounter for screening mammogram for malignant neoplasm of breast: Secondary | ICD-10-CM

## 2019-09-01 ENCOUNTER — Other Ambulatory Visit: Payer: Self-pay

## 2019-09-04 ENCOUNTER — Encounter: Payer: Self-pay | Admitting: Certified Nurse Midwife

## 2019-09-04 ENCOUNTER — Other Ambulatory Visit: Payer: Self-pay

## 2019-09-04 ENCOUNTER — Ambulatory Visit (INDEPENDENT_AMBULATORY_CARE_PROVIDER_SITE_OTHER): Payer: Federal, State, Local not specified - PPO | Admitting: Certified Nurse Midwife

## 2019-09-04 VITALS — BP 120/70 | HR 68 | Temp 97.0°F | Resp 16 | Ht 64.75 in | Wt 208.0 lb

## 2019-09-04 DIAGNOSIS — Z01419 Encounter for gynecological examination (general) (routine) without abnormal findings: Secondary | ICD-10-CM | POA: Diagnosis not present

## 2019-09-04 NOTE — Progress Notes (Signed)
44 y.o. [id number] Married  African American Fe here for annual exam. Periods normal, no issues, duration  4-5 days, heavy 3 days to light. PMS has increased at times, no issues. Keeping menstrual cycle calendar which helps her prepare for period. Took first Covid vaccine yesterday, just sore arm only. . Recent PCP visit with labs all normal. Sees Dr Letta Median for thyroid management and is stable.. No HSV outbreaks or medication update needed. No other health issues today.  Patient's last menstrual period was 08/21/2019 (exact date).          Sexually active: Yes.    The current method of family planning is tubal ligation.    Exercising: No.  exercise Smoker:  no  Review of Systems  Constitutional: Negative.   HENT: Negative.   Eyes: Negative.   Respiratory: Negative.   Cardiovascular: Negative.   Gastrointestinal: Negative.   Genitourinary: Negative.   Musculoskeletal: Negative.   Skin: Negative.   Neurological: Negative.   Endo/Heme/Allergies: Negative.   Psychiatric/Behavioral: Negative.     Health Maintenance: Pap:  10-11-15 neg HPV HR neg, 01-20-2018 neg HPV HR neg History of Abnormal Pap: no MMG:  05-26-18 category b density birads 1:neg Self Breast exams: yes Colonoscopy:  None BMD:   none TDaP:  2009 Shingles: no Pneumonia: no Hep C and HIV: HIV neg during pregnancy Labs: if needed   reports that she has never smoked. She has never used smokeless tobacco. She reports current alcohol use. She reports that she does not use drugs.  Past Medical History:  Diagnosis Date  . Complication of anesthesia 2009   failed epidural, severe pain from second attempt  . HSV-2 infection   . Hypothyroid    graves    Past Surgical History:  Procedure Laterality Date  . CESAREAN SECTION    . DILATION AND CURETTAGE OF UTERUS  1995   TAB  . HYSTEROSCOPY  03/21/2012   Procedure: HYSTEROSCOPY;  Surgeon: Marvene Staff, MD;  Location: [facility];  Service: Gynecology;;  . IUD REMOVAL   03/21/2012   Procedure: INTRAUTERINE DEVICE (IUD) REMOVAL;  Surgeon: Marvene Staff, MD;  Location: [facility];  Service: Gynecology;  Laterality: N/A;  . LAPAROSCOPIC TUBAL LIGATION  03/21/2012   Procedure: LAPAROSCOPIC TUBAL LIGATION;  Surgeon: Marvene Staff, MD;  Location: [facility];  Service: Gynecology;  Laterality: Bilateral;  with cautery  . SUPERFICIAL LYMPH NODE BIOPSY / EXCISION    . TUBAL LIGATION    . UTERINE SUSPENSION  2007    Current Outpatient Medications  Medication Sig Dispense Refill  . cetirizine (ZYRTEC) 10 MG tablet Take 10 mg by mouth daily.    . Multiple Vitamins-Minerals (MULTIVITAMIN PO) Take by mouth.    . SYNTHROID 175 MCG tablet TAKE 1 TABLET (175 MCG TOTAL) BY MOUTH DAILY BEFORE BREAKFAST. 90 tablet 3  . valACYclovir (VALTREX) 1000 MG tablet Take one tablet twice daily for 3 days at onset of outbreak 30 tablet 0   No current facility-administered medications for this visit.    Family History  Problem Relation Age of Onset  . Hypertension Mother   . Allergies Mother   . Rheum arthritis Mother   . Hypertension Father   . Allergies Father   . Breast cancer Paternal Aunt 56  . Thyroid disease Paternal Aunt   . Breast cancer Maternal Aunt 39  . Thyroid disease Brother   . Stroke Maternal Grandmother   . Stroke Paternal Grandmother   . Thyroid disease Paternal Grandmother   .  Thyroid disease Paternal Aunt     ROS:  Pertinent items are noted in HPI.  Otherwise, a comprehensive ROS was negative.  Exam:   BP 120/70   Pulse 68   Temp (!) 97 F (36.1 C) (Skin)   Resp 16   Ht 5' 4.75" (1.645 m)   Wt 208 lb (94.3 kg)   LMP 08/21/2019 (Exact Date)   BMI 34.88 kg/m  Height: 5' 4.75" (164.5 cm) Ht Readings from Last 3 Encounters:  09/04/19 5' 4.75" (1.645 m)  05/03/19 5\' 5"  (1.651 m)  03/23/19 5\' 5"  (1.651 m)    General appearance: alert, cooperative and appears stated age Head: Normocephalic, without obvious abnormality, atraumatic Neck: no  adenopathy, supple, symmetrical, trachea midline and thyroid normal to inspection and palpation Lungs: clear to auscultation bilaterally Breasts: normal appearance, no masses or tenderness, No nipple retraction or dimpling, No nipple discharge or bleeding, No axillary or supraclavicular adenopathy Heart: regular rate and rhythm Abdomen: soft, non-tender; no masses,  no organomegaly Extremities: extremities normal, atraumatic, no cyanosis or edema Skin: Skin color, texture, turgor normal. No rashes or lesions Lymph nodes: Cervical, supraclavicular, and axillary nodes normal. No abnormal inguinal nodes palpated Neurologic: Grossly normal   Pelvic: External genitalia:  no lesions              Urethra:  normal appearing urethra with no masses, tenderness or lesions              Bartholin's and Skene's: normal                 Vagina: normal appearing vagina with normal color and discharge, no lesions              Cervix: no cervical motion tenderness and no lesions              Pap taken: No. Bimanual Exam:  Uterus:  normal size, contour, position, consistency, mobility, non-tender and anteverted              Adnexa: normal adnexa and no mass, fullness, tenderness               Rectovaginal: Confirms               Anus:  normal sphincter tone, no lesions  Chaperone present: yes  A:  Well Woman with normal exam  Contraception Tubal ligation  Hypothyroid management by MD  History of HSV 2 declines Rx update  Family history of breast cancer [place], [place] age 43  P:   Reviewed health and wellness pertinent to exam  Continue follow up with MD regarding management and yearly exam with labs  Patient will call if needed  Stressed importance of yearly mammogram and SBE for early detection with family history  Pap smear: no   counseled on breast self exam, mammography screening, feminine hygiene, adequate intake of calcium and vitamin D, diet and exercise  return annually or prn  An After Visit  Summary was printed and given to the patient.

## 2019-09-04 NOTE — Patient Instructions (Signed)

## 2019-09-08 ENCOUNTER — Encounter: Payer: Self-pay | Admitting: Certified Nurse Midwife

## 2019-09-12 ENCOUNTER — Ambulatory Visit: Payer: Federal, State, Local not specified - PPO

## 2019-10-26 DIAGNOSIS — Z20822 Contact with and (suspected) exposure to covid-19: Secondary | ICD-10-CM | POA: Diagnosis not present

## 2019-10-30 ENCOUNTER — Ambulatory Visit: Payer: Federal, State, Local not specified - PPO | Attending: Internal Medicine

## 2019-10-30 DIAGNOSIS — Z20822 Contact with and (suspected) exposure to covid-19: Secondary | ICD-10-CM | POA: Diagnosis not present

## 2019-10-31 LAB — SARS-COV-2, NAA 2 DAY TAT

## 2019-10-31 LAB — NOVEL CORONAVIRUS, NAA: SARS-CoV-2, NAA: NOT DETECTED

## 2019-11-06 ENCOUNTER — Ambulatory Visit: Payer: Federal, State, Local not specified - PPO

## 2020-03-26 ENCOUNTER — Ambulatory Visit: Payer: Federal, State, Local not specified - PPO | Admitting: Internal Medicine

## 2020-04-26 ENCOUNTER — Ambulatory Visit: Payer: Federal, State, Local not specified - PPO | Admitting: Internal Medicine

## 2020-07-10 ENCOUNTER — Other Ambulatory Visit: Payer: Self-pay

## 2020-07-10 ENCOUNTER — Ambulatory Visit
Admission: RE | Admit: 2020-07-10 | Discharge: 2020-07-10 | Disposition: A | Discharge status: Home / Self Care | Payer: Federal, State, Local not specified - PPO | Source: Ambulatory Visit | Attending: Nurse Practitioner | Admitting: Nurse Practitioner

## 2020-07-10 ENCOUNTER — Other Ambulatory Visit: Payer: Self-pay | Admitting: Nurse Practitioner

## 2020-07-10 DIAGNOSIS — Z1231 Encounter for screening mammogram for malignant neoplasm of breast: Secondary | ICD-10-CM

## 2020-08-15 ENCOUNTER — Other Ambulatory Visit: Payer: Self-pay | Admitting: Internal Medicine

## 2020-08-15 NOTE — Telephone Encounter (Signed)
Last OV 03/2019

## 2020-11-05 ENCOUNTER — Ambulatory Visit (INDEPENDENT_AMBULATORY_CARE_PROVIDER_SITE_OTHER): Payer: Federal, State, Local not specified - PPO | Admitting: Internal Medicine

## 2020-11-05 ENCOUNTER — Encounter: Payer: Self-pay | Admitting: Internal Medicine

## 2020-11-05 ENCOUNTER — Other Ambulatory Visit: Payer: Self-pay

## 2020-11-05 VITALS — BP 130/88 | HR 78 | Ht 64.75 in | Wt 216.0 lb

## 2020-11-05 DIAGNOSIS — E890 Postprocedural hypothyroidism: Secondary | ICD-10-CM

## 2020-11-05 MED ORDER — SYNTHROID 175 MCG PO TABS: 175.0000 ug | ORAL_TABLET | Freq: Every day | ORAL | 3 refills | Status: DC

## 2020-11-05 NOTE — Patient Instructions (Addendum)
Please come back for labs in 5-6 weeks.  Please restart Synthroid 175 mcg daily.  Take the thyroid hormone every day, with water, at least 30 minutes before breakfast, separated by at least 4 hours from: - acid reflux medications - calcium - iron - multivitamins  Please return in 1 year.

## 2020-11-05 NOTE — Progress Notes (Signed)
Subjective:     Patient ID: Jordan [name], female   DOB: January 05, 1976, 45 y.o.   MRN: [id number]  This visit occurred during the SARS-CoV-2 public health emergency.  Safety protocols were in place, including screening questions prior to the visit, additional usage of staff PPE, and extensive cleaning of exam room while observing appropriate contact time as indicated for disinfecting solutions.   HPI Jordan [name] is a 45 y.o. woman, returning for followup for hypothyroidism post RAI ablation for Graves' disease in 1997. Last visit 1 year and 6 months ago.  Interim history:  At last visit, she was doing a religious fast: no sugar, meat, starches. Only veggies, fruits, eggs, oatmeal. Lost 8 lbs. Since then, she gained approx. 9 lbs. she is thinking about resuming the past. She has no complaints other than blurry vision-due to progressive lenses, and fatigue, especially after running out of Synthroid 6 days ago.  Reviewed her TFTs: Lab Results  Component Value Date   TSH 1.21 03/23/2019   TSH 0.86 03/22/2018   TSH 2.54 02/09/2017   TSH 3.28 08/20/2015   TSH 0.85 02/18/2015   TSH 0.36 10/18/2014   FREET4 1.08 03/23/2019   FREET4 1.08 03/22/2018   FREET4 1.01 02/09/2017   FREET4 0.94 08/20/2015   FREET4 0.90 02/18/2015   FREET4 0.91 10/18/2014   Pt is on Synthroid 175 mcg daily, taken: - in am - fasting - at least 30 min from b'fast - no Ca, Fe, PPIs - + Multivitamins at night - not on Biotin  Pt denies: - feeling nodules in neck - hoarseness - dysphagia - choking - SOB with lying down  No FH of thyroid cancer. No h/o radiation tx to head or neck.  No herbal supplements. No Biotin use. No recent steroids use.   She used to do [employer] in the past but stopped after developed bronchitis in 2019.  Review of Systems Constitutional: no weight gain/no weight loss, + fatigue, no subjective hyperthermia, no subjective hypothermia Eyes: + blurry vision, no xerophthalmia ENT: no  sore throat, + see HPI Cardiovascular: no CP/no SOB/no palpitations/no leg swelling Respiratory: no cough/no SOB/no wheezing Gastrointestinal: no N/no V/no D/no C/no acid reflux Musculoskeletal: no muscle aches/no joint aches Skin: no rashes, no hair loss Neurological: no tremors/no numbness/no tingling/no dizziness  I reviewed pt's medications, allergies, PMH, social hx, family hx, and changes were documented in the history of present illness. Otherwise, unchanged from my initial visit note.  Past Medical History:  Diagnosis Date  . Complication of anesthesia 2009   failed epidural, severe pain from second attempt  . HSV-2 infection   . Hypothyroid    graves   Past Surgical History:  Procedure Laterality Date  . CESAREAN SECTION    . DILATION AND CURETTAGE OF UTERUS  1995   TAB  . HYSTEROSCOPY  03/21/2012   Procedure: HYSTEROSCOPY;  Surgeon: Marvene Staff, MD;  Location: [facility];  Service: Gynecology;;  . IUD REMOVAL  03/21/2012   Procedure: INTRAUTERINE DEVICE (IUD) REMOVAL;  Surgeon: Marvene Staff, MD;  Location: [facility];  Service: Gynecology;  Laterality: N/A;  . LAPAROSCOPIC TUBAL LIGATION  03/21/2012   Procedure: LAPAROSCOPIC TUBAL LIGATION;  Surgeon: Marvene Staff, MD;  Location: [facility];  Service: Gynecology;  Laterality: Bilateral;  with cautery  . SUPERFICIAL LYMPH NODE BIOPSY / EXCISION    . TUBAL LIGATION    . UTERINE SUSPENSION  2007   Social History   Socioeconomic History  . Marital  status: Married    Spouse name: Not on file  . Number of children: 2  . Years of education: Not on file  . Highest education level: Not on file  Occupational History  . Not on file  Tobacco Use  . Smoking status: Never Smoker  . Smokeless tobacco: Never Used  Substance and Sexual Activity  . Alcohol use: Yes    Comment: once every 2 mths  . Drug use: No  . Sexual activity: Yes    Partners: Male    Birth control/protection: Surgical    Comment: tubal  ligation  Other Topics Concern  . Not on file  Social History Narrative  . Not on file   Social Determinants of Health   Financial Resource Strain: Not on file  Food Insecurity: Not on file  Transportation Needs: Not on file  Physical Activity: Not on file  Stress: Not on file  Social Connections: Not on file  Intimate Partner Violence: Not on file   Current Outpatient Medications on File Prior to Visit  Medication Sig Dispense Refill  . cetirizine (ZYRTEC) 10 MG tablet Take 10 mg by mouth daily.    . Multiple Vitamins-Minerals (MULTIVITAMIN PO) Take by mouth.    . SYNTHROID 175 MCG tablet TAKE 1 TABLET (175 MCG TOTAL) BY MOUTH DAILY BEFORE BREAKFAST. 90 tablet 3  . valACYclovir (VALTREX) 1000 MG tablet Take one tablet twice daily for 3 days at onset of outbreak 30 tablet 0   No current facility-administered medications on file prior to visit.   No Known Allergies Family History  Problem Relation Age of Onset  . Hypertension Mother   . Allergies Mother   . Rheum arthritis Mother   . Hypertension Father   . Allergies Father   . Breast cancer Paternal Aunt 5  . Thyroid disease Paternal Aunt   . Breast cancer Maternal Aunt 47  . Thyroid disease Brother   . Stroke Maternal Grandmother   . Stroke Paternal Grandmother   . Thyroid disease Paternal Grandmother   . Thyroid disease Paternal Aunt     Objective:   Physical Exam BP 130/88 (BP Location: Right Arm, Patient Position: Sitting, Cuff Size: Normal)   Pulse 78   Ht 5' 4.75" (1.645 m)   Wt 216 lb (98 kg)   SpO2 95%   BMI 36.22 kg/m  Body mass index is 36.22 kg/m. Wt Readings from Last 3 Encounters:  11/05/20 216 lb (98 kg)  09/04/19 208 lb (94.3 kg)  05/03/19 214 lb (97.1 kg)   Constitutional: overweight, in NAD Eyes: PERRLA, EOMI, no exophthalmos ENT: moist mucous membranes, no thyromegaly, no cervical lymphadenopathy Cardiovascular: RRR, No MRG Respiratory: CTA B Gastrointestinal: abdomen soft, NT, ND,  BS+ Musculoskeletal: no deformities, strength intact in all 4 Skin: moist, warm, no rashes Neurological: no tremor with outstretched hands, DTR normal in all 4  Assessment:     1. 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 - latest thyroid labs reviewed with pt >> normal: Lab Results  Component Value Date   TSH 1.21 03/23/2019  - she continues on Synthroid d.a.w. 175 mcg daily, however, she ran out 6 days ago.  We discussed about symptoms of hypothyroidism and she does have fatigue at today's visit and also gained  few pounds.  I strongly advised her not to run out anymore and I refilled her prescription. - we discussed about taking the thyroid hormone every day, with water, >30 minutes before breakfast, separated by >4 hours from acid reflux medications, calcium, iron, multivitamins. Pt. is taking it correctly. - will check thyroid tests in 1.5 mo: TSH and fT4, after she resumes Synthroid. - no signs of Graves' ophthalmopathy: Blurry vision, double vision, chemosis, eye pain, exophthalmos -I will see her back in a year.  Orders Placed This Encounter  Procedures  . TSH  . T4, free   Philemon Kingdom, MD PhD [facility] Endocrinology

## 2020-12-13 ENCOUNTER — Other Ambulatory Visit (INDEPENDENT_AMBULATORY_CARE_PROVIDER_SITE_OTHER): Payer: Federal, State, Local not specified - PPO

## 2020-12-13 ENCOUNTER — Other Ambulatory Visit: Payer: Self-pay

## 2020-12-13 DIAGNOSIS — E890 Postprocedural hypothyroidism: Secondary | ICD-10-CM

## 2020-12-13 LAB — TSH: TSH: 2.88 u[IU]/mL (ref 0.35–4.50)

## 2020-12-13 LAB — T4, FREE: Free T4: 0.96 ng/dL (ref 0.60–1.60)

## 2020-12-17 NOTE — Progress Notes (Signed)
45 y.o. [id number] Married [place] American female here for annual exam.    Menses are manageable.   Feels she has gained weight.   Has urinary incontinence.  Can leak with cough/laugh/sneeze and for no reason at all.  If waits too long, she leaks.  Wears a pad.  DF - 3 - 4 times a day. NF - none.  Caffeine - 1 coffee.  Drinks lemonade.  No problems controlling bowel function.   Saw her endocrinologist, Dr. Cruzita Lederer, for thyroid follow up.  Received her Covid booster.   2 children:  49 and 11 yo.   PCP:   Wilfred Lacy, NP  Patient's last menstrual period was 11/30/2020 (approximate).     Period Cycle (Days): 30 Period Duration (Days): 4-6 Period Pattern: Regular Menstrual Flow: Moderate (every other month very heavy) Menstrual Control: Maxi pad, Tampon Menstrual Control Change Freq (Hours): changes maxi pad/and super tampon every 2-3 hours on heaviest day Dysmenorrhea: (!) Moderate Dysmenorrhea Symptoms: Cramping, Headache     Sexually active: Yes.    The current method of family planning is tubal ligation.    Exercising: No.  The patient does not participate in regular exercise at present. Smoker:  no  Health Maintenance: Pap: 01-20-18 Neg:Neg HR HPV, 10-11-15 Neg:Neg HR HPV, 09-11-13 Neg:Neg HR HPV History of abnormal Pap:  no MMG: 07-10-20 3D/Neg/BiRads1 Colonoscopy:  none BMD:   n/a  Result  n/a TDaP: 2009, PCP update? Gardasil:   no HIV: Neg in preg Hep C: Unsure Screening Labs:  PCP and endocrinology.    reports that she has never smoked. She has never used smokeless tobacco. She reports current alcohol use. She reports that she does not use drugs.  Past Medical History:  Diagnosis Date   Complication of anesthesia 2009   failed epidural, severe pain from second attempt   HSV-2 infection    Hypothyroid    graves    Past Surgical History:  Procedure Laterality Date   CESAREAN SECTION     DILATION AND CURETTAGE OF UTERUS  1995   TAB   HYSTEROSCOPY   03/21/2012   Procedure: HYSTEROSCOPY;  Surgeon: Marvene Staff, MD;  Location: [facility];  Service: Gynecology;;   IUD REMOVAL  03/21/2012   Procedure: INTRAUTERINE DEVICE (IUD) REMOVAL;  Surgeon: Marvene Staff, MD;  Location: [facility];  Service: Gynecology;  Laterality: N/A;   LAPAROSCOPIC TUBAL LIGATION  03/21/2012   Procedure: LAPAROSCOPIC TUBAL LIGATION;  Surgeon: Marvene Staff, MD;  Location: [facility];  Service: Gynecology;  Laterality: Bilateral;  with cautery   SUPERFICIAL LYMPH NODE BIOPSY / EXCISION     TUBAL LIGATION     UTERINE SUSPENSION  2007    Current Outpatient Medications  Medication Sig Dispense Refill   cetirizine (ZYRTEC) 10 MG tablet Take 10 mg by mouth daily.     Multiple Vitamins-Minerals (MULTIVITAMIN PO) Take by mouth.     SYNTHROID 175 MCG tablet Take 1 tablet (175 mcg total) by mouth daily before breakfast. 45 tablet 3   valACYclovir (VALTREX) 1000 MG tablet Take one tablet twice daily for 3 days at onset of outbreak 30 tablet 0   No current facility-administered medications for this visit.    Family History  Problem Relation Age of Onset   Hypertension Mother    Allergies Mother    Rheum arthritis Mother    Hypertension Father    Allergies Father    Thyroid disease Brother    Breast cancer Maternal Aunt 85  Breast cancer Paternal Aunt 65   Thyroid disease Paternal Aunt    Thyroid disease Paternal Aunt    Stroke Maternal Grandmother    Stroke Paternal Grandmother    Thyroid disease Paternal Grandmother     Review of Systems  Genitourinary:        Urinary incontinence  All other systems reviewed and are negative.  Exam:   BP 132/84 (Cuff Size: Large)   Pulse 82   Ht 5\' 5"  (1.651 m)   Wt 215 lb (97.5 kg)   LMP 11/30/2020 (Approximate)   SpO2 100%   BMI 35.78 kg/m     General appearance: alert, cooperative and appears stated age Head: normocephalic, without obvious abnormality, atraumatic Neck: no adenopathy, supple,  symmetrical, trachea midline and thyroid normal to inspection and palpation Lungs: clear to auscultation bilaterally Breasts: normal appearance, no masses or tenderness, No nipple retraction or dimpling, No nipple discharge or bleeding, No axillary adenopathy Heart: regular rate and rhythm Abdomen: soft, non-tender; no masses, no organomegaly Extremities: extremities normal, atraumatic, no cyanosis or edema Skin: skin color, texture, turgor normal. No rashes or lesions Lymph nodes: cervical, supraclavicular, and axillary nodes normal. Neurologic: grossly normal  Pelvic: External genitalia:  no lesions              No abnormal inguinal nodes palpated.              Urethra:  normal appearing urethra with no masses, tenderness or lesions              Bartholins and Skenes: normal                 Vagina: normal appearing vagina with normal color and discharge, no lesions.  Good support.  Kegel practiced with patient.               Cervix: no lesions              Pap taken: no Bimanual Exam:  Uterus:  normal size, contour, position, consistency, mobility, non-tender              Adnexa: no mass, fullness, tenderness              Rectal exam: yes.  Confirms.              Anus:  normal sphincter tone, no lesions  Chaperone was present for exam.  Assessment:   Well woman visit with normal exam. Status post BTL.  Status post uterine suspension.  Mixed incontinence.  HSV 2.  FH breast cancer paternal and maternal aunts. Postablative hypothyroidism.   Plan: Mammogram screening discussed. Self breast awareness reviewed. Pap and HR HPV as above. Guidelines for Calcium, Vitamin D, regular exercise program including cardiovascular and weight bearing exercise. Kegel's reviewed.  Dietary irritants discussed.  Weight loss encouraged.  Surgery mentioned only briefly. She declines referral for pelvic floor therapy.  Refill of Valtrex.  Follow up annually and prn.

## 2020-12-18 ENCOUNTER — Other Ambulatory Visit: Payer: Self-pay

## 2020-12-18 ENCOUNTER — Encounter: Payer: Self-pay | Admitting: Obstetrics and Gynecology

## 2020-12-18 ENCOUNTER — Ambulatory Visit (INDEPENDENT_AMBULATORY_CARE_PROVIDER_SITE_OTHER): Payer: Federal, State, Local not specified - PPO | Admitting: Obstetrics and Gynecology

## 2020-12-18 VITALS — BP 132/84 | HR 82 | Ht 65.0 in | Wt 215.0 lb

## 2020-12-18 DIAGNOSIS — Z01419 Encounter for gynecological examination (general) (routine) without abnormal findings: Secondary | ICD-10-CM | POA: Diagnosis not present

## 2020-12-18 DIAGNOSIS — Z8619 Personal history of other infectious and parasitic diseases: Secondary | ICD-10-CM

## 2020-12-18 DIAGNOSIS — N3946 Mixed incontinence: Secondary | ICD-10-CM | POA: Diagnosis not present

## 2020-12-18 MED ORDER — VALACYCLOVIR HCL 1 G PO TABS
ORAL_TABLET | ORAL | 1 refills | Status: DC
Start: 2020-12-18 — End: 2022-01-27

## 2020-12-18 NOTE — Patient Instructions (Addendum)
Urinary Incontinence  Urinary incontinence refers to a condition in which a person is unable to control where and when to pass urine. A person with this condition will urinate when he or she does not mean to (involuntarily). What are the causes? This condition may be caused by: Medicines. Infections. Constipation. Overactive bladder muscles. Weak bladder muscles. Weak pelvic floor muscles. These muscles provide support for the bladder, intestine, and, in women, the uterus. Enlarged prostate in men. The prostate is a gland near the bladder. When it gets too big, it can pinch the urethra. With the urethra blocked, the bladder can weaken and lose the ability to empty properly. Surgery. Emotional factors, such as anxiety, stress, or post-traumatic stress disorder (PTSD). Pelvic organ prolapse. This happens in women when organs shift out of place and into the vagina. This shift can prevent the bladder and urethra from working properly. What increases the risk? The following factors may make you more likely to develop this condition: Older age. Obesity and physical inactivity. Pregnancy and childbirth. Menopause. Diseases that affect the nerves or spinal cord (neurological diseases). Long-term (chronic) coughing. This can increase pressure on the bladder and pelvic floor muscles. What are the signs or symptoms? Symptoms may vary depending on the type of urinary incontinence you have. They include: A sudden urge to urinate, but passing urine involuntarily before you can get to a bathroom (urge incontinence). Suddenly passing urine with any activity that forces urine to pass, such as coughing, laughing, exercise, or sneezing (stress incontinence). Needing to urinate often, but urinating only a small amount, or constantly dribbling urine (overflow incontinence). Urinating because you cannot get to the bathroom in time due to a physical disability, such as arthritis or injury, or communication and  thinking problems, such as Alzheimer disease (functional incontinence). How is this diagnosed? This condition may be diagnosed based on: Your medical history. A physical exam. Tests, such as: Urine tests. X-rays of your kidney and bladder. Ultrasound. CT scan. Cystoscopy. In this procedure, a health care provider inserts a tube with a light and camera (cystoscope) through the urethra and into the bladder in order to check for problems. Urodynamic testing. These tests assess how well the bladder, urethra, and sphincter can store and release urine. There are different types of urodynamic tests, and they vary depending on what the test is measuring. To help diagnose your condition, your health care provider may recommend thatyou keep a log of when you urinate and how much you urinate. How is this treated? Treatment for this condition depends on the type of incontinence that you have and its cause. Treatment may include: Lifestyle changes, such as: Quitting smoking. Maintaining a healthy weight. Staying active. Try to get 150 minutes of moderate-intensity exercise every week. Ask your health care provider which activities are safe for you. Eating a healthy diet. Avoid high-fat foods, like fried foods. Avoid refined carbohydrates like white bread and white rice. Limit how much alcohol and caffeine you drink. Increase your fiber intake. Foods such as fresh fruits, vegetables, beans, and whole grains are healthy sources of fiber. Pelvic floor muscle exercises. Bladder training, such as lengthening the amount of time between bathroom breaks, or using the bathroom at regular intervals. Using techniques to suppress bladder urges. This can include distraction techniques or controlled breathing exercises. Medicines to relax the bladder muscles and prevent bladder spasms. Medicines to help slow or prevent the growth of a man's prostate. Botox injections. These can help relax the bladder  muscles. Using  pulses of electricity to help change bladder reflexes (electrical nerve stimulation). For women, using a medical device to prevent urine leaks. This is a small, tampon-like, disposable device that is inserted into the urethra. Injecting collagen or carbon beads (bulking agents) into the urinary sphincter. These can help thicken tissue and close the bladder opening. Surgery. Follow these instructions at home: Lifestyle Limit alcohol and caffeine. These can fill your bladder quickly and irritate it. Keep yourself clean to help prevent odors and skin damage. Ask your doctor about special skin creams and cleansers that can protect the skin from urine. Consider wearing pads or adult diapers. Make sure to change them regularly, and always change them right after experiencing incontinence. General instructions Take over-the-counter and prescription medicines only as told by your health care provider. Use the bathroom about every 3-4 hours, even if you do not feel the need to urinate. Try to empty your bladder completely every time. After urinating, wait a minute. Then try to urinate again. Make sure you are in a relaxed position while urinating. If your incontinence is caused by nerve problems, keep a log of the medicines you take and the times you go to the bathroom. Keep all follow-up visits as told by your health care provider. This is important. Contact a health care provider if: You have pain that gets worse. Your incontinence gets worse. Get help right away if: You have a fever or chills. You are unable to urinate. You have redness in your groin area or down your legs. Summary Urinary incontinence refers to a condition in which a person is unable to control where and when to pass urine. This condition may be caused by medicines, infection, weak bladder muscles, weak pelvic floor muscles, enlargement of the prostate (in men), or surgery. The following factors increase your risk  for developing this condition: older age, obesity, pregnancy and childbirth, menopause, neurological diseases, and chronic coughing. There are several types of urinary incontinence. They include urge incontinence, stress incontinence, overflow incontinence, and functional incontinence. This condition is usually treated first with lifestyle and behavioral changes, such as quitting smoking, eating a healthier diet, and doing regular pelvic floor exercises. Other treatment options include medicines, bulking agents, medical devices, electrical nerve stimulation, or surgery. This information is not intended to replace advice given to you by your health care provider. Make sure you discuss any questions you have with your healthcare provider. Document Revised: 06/18/2017 Document Reviewed: 09/17/2016 Elsevier Patient Education  [place].  Kegel Exercises  Kegel exercises can help strengthen your pelvic floor muscles. The pelvic floor is a group of muscles that support your rectum, small intestine, and bladder. In females, pelvic floor muscles also help support the womb (uterus). These muscles help you control the flow of urine and stool. Kegel exercises are painless and simple, and they do not require any equipment. Your provider may suggest Kegel exercises to: Improve bladder and bowel control. Improve sexual response. Improve weak pelvic floor muscles after surgery to remove the uterus (hysterectomy) or pregnancy (females). Improve weak pelvic floor muscles after prostate gland removal or surgery (males). Kegel exercises involve squeezing your pelvic floor muscles, which are the same muscles you squeeze when you try to stop the flow of urine or keep from passing gas. The exercises can be done while sitting, standing, or lying down, but itis best to vary your position. Exercises How to do Kegel exercises: Squeeze your pelvic floor muscles tight. You should feel a tight lift in your  rectal  area. If you are a female, you should also feel a tightness in your vaginal area. Keep your stomach, buttocks, and legs relaxed. Hold the muscles tight for up to 10 seconds. Breathe normally. Relax your muscles. Repeat as told by your health care provider. Repeat this exercise daily as told by your health care provider. Continue to do this exercise for at least 4-6 weeks, or for as long as told by your healthcare provider. You may be referred to a physical therapist who can help you learn more abouthow to do Kegel exercises. Depending on your condition, your health care provider may recommend: Varying how long you squeeze your muscles. Doing several sets of exercises every day. Doing exercises for several weeks. Making Kegel exercises a part of your regular exercise routine. This information is not intended to replace advice given to you by your health care provider. Make sure you discuss any questions you have with your healthcare provider. Document Revised: 05/29/2020 Document Reviewed: 01/26/2018 Elsevier Patient Education  [place] DIET:  We recommended that you start or continue a regular exercise program for good health. Regular exercise means any activity that makes your heart beat faster and makes you sweat.  We recommend exercising at least 30 minutes per day at least 3 days a week, preferably 4 or 5.  We also recommend a diet low in fat and sugar.  Inactivity, poor dietary choices and obesity can cause diabetes, heart attack, stroke, and kidney damage, among others.    ALCOHOL AND SMOKING:  Women should limit their alcohol intake to no more than 7 drinks/beers/glasses of wine (combined, not each!) per week. Moderation of alcohol intake to this level decreases your risk of breast cancer and liver damage. And of course, no recreational drugs are part of a healthy lifestyle.  And absolutely no smoking or even second hand smoke. Most people know smoking can cause  heart and lung diseases, but did you know it also contributes to weakening of your bones? Aging of your skin?  Yellowing of your teeth and nails?  CALCIUM AND VITAMIN D:  Adequate intake of calcium and Vitamin D are recommended.  The recommendations for exact amounts of these supplements seem to change often, but generally speaking 600 mg of calcium (either carbonate or citrate) and 800 units of Vitamin D per day seems prudent. Certain women may benefit from higher intake of Vitamin D.  If you are among these women, your doctor will have told you during your visit.    PAP SMEARS:  Pap smears, to check for cervical cancer or precancers,  have traditionally been done yearly, although recent scientific advances have shown that most women can have pap smears less often.  However, every woman still should have a physical exam from her gynecologist every year. It will include a breast check, inspection of the vulva and vagina to check for abnormal growths or skin changes, a visual exam of the cervix, and then an exam to evaluate the size and shape of the uterus and ovaries.  And after 45 years of age, a rectal exam is indicated to check for rectal cancers. We will also provide age appropriate advice regarding health maintenance, like when you should have certain vaccines, screening for sexually transmitted diseases, bone density testing, colonoscopy, mammograms, etc.   MAMMOGRAMS:  All women over 41 years old should have a yearly mammogram. Many facilities now offer a "3D" mammogram, which may cost around $50 extra out  of pocket. If possible,  we recommend you accept the option to have the 3D mammogram performed.  It both reduces the number of women who will be called back for extra views which then turn out to be normal, and it is better than the routine mammogram at detecting truly abnormal areas.    COLONOSCOPY:  Colonoscopy to screen for colon cancer is recommended for all women at age 61.  We know, you hate  the idea of the prep.  We agree, BUT, having colon cancer and not knowing it is worse!!  Colon cancer so often starts as a polyp that can be seen and removed at colonscopy, which can quite literally save your life!  And if your first colonoscopy is normal and you have no family history of colon cancer, most women don't have to have it again for 10 years.  Once every ten years, you can do something that may end up saving your life, right?  We will be happy to help you get it scheduled when you are ready.  Be sure to check your insurance coverage so you understand how much it will cost.  It may be covered as a preventative service at no cost, but you should check your particular policy.

## 2020-12-24 ENCOUNTER — Ambulatory Visit: Payer: Federal, State, Local not specified - PPO | Admitting: Obstetrics & Gynecology

## 2021-05-19 ENCOUNTER — Ambulatory Visit (HOSPITAL_COMMUNITY): Payer: Self-pay

## 2021-07-09 ENCOUNTER — Other Ambulatory Visit: Payer: Self-pay | Admitting: Nurse Practitioner

## 2021-07-09 DIAGNOSIS — Z1231 Encounter for screening mammogram for malignant neoplasm of breast: Secondary | ICD-10-CM

## 2021-07-15 ENCOUNTER — Telehealth: Payer: Self-pay | Admitting: Internal Medicine

## 2021-07-15 DIAGNOSIS — E890 Postprocedural hypothyroidism: Secondary | ICD-10-CM

## 2021-07-15 MED ORDER — SYNTHROID 175 MCG PO TABS: 175.0000 ug | ORAL_TABLET | Freq: Every day | ORAL | 1 refills | Status: DC

## 2021-07-15 NOTE — Telephone Encounter (Signed)
MEDICATION: SYNTHROID 175 MCG tablet  PHARMACY:   [id number] [place], [place] - [address] Phone:  [phone]  Fax:  [phone]      HAS THE PATIENT CONTACTED THEIR PHARMACY?  Yes-requires new RX  IS THIS A 90 DAY SUPPLY : ?  IS PATIENT OUT OF MEDICATION: Yes-out for at least 3 days  IF NOT; HOW MUCH IS LEFT: 0  LAST APPOINTMENT DATE: @5 /17/2022  NEXT APPOINTMENT DATE:@5 /23/2023  DO WE HAVE YOUR PERMISSION TO LEAVE A DETAILED MESSAGE?: Yes  OTHER COMMENTS:    **Let patient know to contact pharmacy at the end of the day to make sure medication is ready. **  ** Please notify patient to allow 48-72 hours to process**  **Encourage patient to contact the pharmacy for refills or they can request refills through [facility]**

## 2021-07-15 NOTE — Telephone Encounter (Signed)
Rx sent to preferred pharmacy.

## 2021-07-22 ENCOUNTER — Ambulatory Visit
Admission: RE | Admit: 2021-07-22 | Discharge: 2021-07-22 | Disposition: A | Discharge status: Home / Self Care | Payer: Federal, State, Local not specified - PPO | Source: Ambulatory Visit

## 2021-07-22 DIAGNOSIS — Z1231 Encounter for screening mammogram for malignant neoplasm of breast: Secondary | ICD-10-CM

## 2021-09-19 ENCOUNTER — Ambulatory Visit: Payer: Federal, State, Local not specified - PPO | Admitting: Nurse Practitioner

## 2021-10-20 ENCOUNTER — Ambulatory Visit: Payer: Federal, State, Local not specified - PPO | Admitting: Nurse Practitioner

## 2021-11-04 ENCOUNTER — Ambulatory Visit: Payer: Federal, State, Local not specified - PPO | Admitting: Internal Medicine

## 2021-11-11 ENCOUNTER — Encounter: Payer: Self-pay | Admitting: Internal Medicine

## 2021-11-11 ENCOUNTER — Ambulatory Visit (INDEPENDENT_AMBULATORY_CARE_PROVIDER_SITE_OTHER): Payer: Federal, State, Local not specified - PPO | Admitting: Internal Medicine

## 2021-11-11 VITALS — BP 130/88 | HR 86 | Ht 65.0 in | Wt 210.2 lb

## 2021-11-11 DIAGNOSIS — E890 Postprocedural hypothyroidism: Secondary | ICD-10-CM

## 2021-11-11 LAB — T4, FREE: Free T4: 1.17 ng/dL (ref 0.60–1.60)

## 2021-11-11 LAB — TSH: TSH: 0.19 u[IU]/mL — ABNORMAL LOW (ref 0.35–5.50)

## 2021-11-11 MED ORDER — SYNTHROID 150 MCG PO TABS: 150.0000 ug | ORAL_TABLET | Freq: Every day | ORAL | 3 refills | Status: DC

## 2021-11-11 NOTE — Progress Notes (Signed)
Subjective:     Patient ID: Jordan [name], female   DOB: 08/31/1975, 46 y.o.   MRN: [id number]  This visit occurred during the SARS-CoV-2 public health emergency.  Safety protocols were in place, including screening questions prior to the visit, additional usage of staff PPE, and extensive cleaning of exam room while observing appropriate contact time as indicated for disinfecting solutions.   HPI Ms Lakatos is a 46 y.o. woman, returning for followup for hypothyroidism post RAI ablation for Graves' disease in 1997. Last visit 1 year ago.  Interim history:  She has no complaints at this visit other than seasonal allergies - cough, congestion. No weight changes or increased fatigue, except when taking allergy medications.  Reviewed her TFTs: Lab Results  Component Value Date   TSH 2.88 12/13/2020   TSH 1.21 03/23/2019   TSH 0.86 03/22/2018   TSH 2.54 02/09/2017   TSH 3.28 08/20/2015   TSH 0.85 02/18/2015   FREET4 0.96 12/13/2020   FREET4 1.08 03/23/2019   FREET4 1.08 03/22/2018   FREET4 1.01 02/09/2017   FREET4 0.94 08/20/2015   FREET4 0.90 02/18/2015   Pt is on Synthroid 175 mcg daily, taken: - in am - fasting - at least 30 min from b'fast - no Ca, Fe, PPIs - + Multivitamins at night (may miss) - not on Biotin - started a probiotic - late afternoon  Pt denies: - feeling nodules in neck - hoarseness - dysphagia - choking - SOB with lying down  No FH of thyroid cancer. No h/o radiation tx to head or neck. No herbal supplements. No Biotin use. No recent steroids use.   She used to do [employer] in the past but stopped after developed bronchitis in 2019.  Review of Systems + see HPI  I reviewed pt's medications, allergies, PMH, social hx, family hx, and changes were documented in the history of present illness. Otherwise, unchanged from my initial visit note.  Past Medical History:  Diagnosis Date   Complication of anesthesia 2009   failed epidural, severe pain  from second attempt   HSV-2 infection    Hypothyroid    graves   Past Surgical History:  Procedure Laterality Date   CESAREAN SECTION     DILATION AND CURETTAGE OF UTERUS  1995   TAB   HYSTEROSCOPY  03/21/2012   Procedure: HYSTEROSCOPY;  Surgeon: Marvene Staff, MD;  Location: [facility];  Service: Gynecology;;   IUD REMOVAL  03/21/2012   Procedure: INTRAUTERINE DEVICE (IUD) REMOVAL;  Surgeon: Marvene Staff, MD;  Location: [facility];  Service: Gynecology;  Laterality: N/A;   LAPAROSCOPIC TUBAL LIGATION  03/21/2012   Procedure: LAPAROSCOPIC TUBAL LIGATION;  Surgeon: Marvene Staff, MD;  Location: [facility];  Service: Gynecology;  Laterality: Bilateral;  with cautery   SUPERFICIAL LYMPH NODE BIOPSY / EXCISION     TUBAL LIGATION     UTERINE SUSPENSION  2007   Social History   Socioeconomic History   Marital status: Married    Spouse name: Not on file   Number of children: 2   Years of education: Not on file   Highest education level: Not on file  Occupational History   Not on file  Tobacco Use   Smoking status: Never   Smokeless tobacco: Never  Vaping Use   Vaping Use: Never used  Substance and Sexual Activity   Alcohol use: Yes    Comment: once every 2 mths   Drug use: No   Sexual activity:  Yes    Partners: Male    Birth control/protection: Surgical    Comment: tubal ligation  Other Topics Concern   Not on file  Social History Narrative   Not on file   Social Determinants of Health   Financial Resource Strain: Not on file  Food Insecurity: Not on file  Transportation Needs: Not on file  Physical Activity: Not on file  Stress: Not on file  Social Connections: Not on file  Intimate Partner Violence: Not on file   Current Outpatient Medications on File Prior to Visit  Medication Sig Dispense Refill   cetirizine (ZYRTEC) 10 MG tablet Take 10 mg by mouth daily.     Multiple Vitamins-Minerals (MULTIVITAMIN PO) Take by mouth.     SYNTHROID 175 MCG tablet  Take 1 tablet (175 mcg total) by mouth daily before breakfast. 90 tablet 1   valACYclovir (VALTREX) 1000 MG tablet Take one tablet twice daily for 3 days at onset of outbreak 30 tablet 1   No current facility-administered medications on file prior to visit.   No Known Allergies Family History  Problem Relation Age of Onset   Hypertension Mother    Allergies Mother    Rheum arthritis Mother    Hypertension Father    Allergies Father    Thyroid disease Brother    Breast cancer Maternal Aunt 22   Breast cancer Paternal Aunt 33   Thyroid disease Paternal Aunt    Thyroid disease Paternal Aunt    Stroke Maternal Grandmother    Stroke Paternal Grandmother    Thyroid disease Paternal Grandmother     Objective:   Physical Exam BP 130/88 (BP Location: Left Arm, Patient Position: Sitting, Cuff Size: Normal)   Pulse 86   Ht '5\' 5"'$  (1.651 m)   Wt 210 lb 3.2 oz (95.3 kg)   SpO2 99%   BMI 34.98 kg/m   Wt Readings from Last 3 Encounters:  11/11/21 210 lb 3.2 oz (95.3 kg)  12/18/20 215 lb (97.5 kg)  11/05/20 216 lb (98 kg)   Constitutional: overweight, in NAD Eyes: PERRLA, EOMI, no exophthalmos ENT: moist mucous membranes, no thyromegaly, no cervical lymphadenopathy Cardiovascular: RRR, No MRG Respiratory: CTA B Musculoskeletal: no deformities, strength intact in all 4 Skin: moist, warm, no rashes Neurological: no tremor with outstretched hands, DTR normal in all 4  Assessment:     1. Iatrogenic hypothyroidism - post ablation for Graves' disease in 1997    Plan:     Pt with longstanding, post ablative hypothyroidism, with previous variable control due to noncompliance with her levothyroxine but with improved control after she started to take the medication as prescribed. - latest thyroid labs reviewed with pt. >> normal: Lab Results  Component Value Date   TSH 2.88 12/13/2020  - she continues on LT4 175 mcg daily (Synthroid d.a.w.) - pt feels good on this dose. - we  discussed about taking the thyroid hormone every day, with water, >30 minutes before breakfast, separated by >4 hours from acid reflux medications, calcium, iron, multivitamins. Pt. is taking it correctly. - No signs of Graves' ophthalmopathy: No blurry vision, double vision, eye pain, chemosis - will check thyroid tests today: TSH and fT4 - If labs are abnormal, she will need to return for repeat TFTs in 1.5 months - OTW, I will see her back in a year  Needs refills.  Component     Latest Ref Rng 11/11/2021  T4,Free(Direct)     0.60 - 1.60 ng/dL 1.17  TSH     0.35 - 5.50 uIU/mL 0.19 (L)    TSH is slightly low.  We will back off the levothyroxine to 150 mcg daily and recheck her test in 1.5 months.  Philemon Kingdom, MD PhD [facility] Endocrinology

## 2021-11-11 NOTE — Patient Instructions (Signed)
Please stop at the lab.  Please restart Synthroid 175 mcg daily.  Take the thyroid hormone every day, with water, at least 30 minutes before breakfast, separated by at least 4 hours from: - acid reflux medications - calcium - iron - multivitamins  Please return in 1 year.

## 2021-11-12 ENCOUNTER — Encounter: Payer: Federal, State, Local not specified - PPO | Admitting: Nurse Practitioner

## 2022-01-08 DIAGNOSIS — N921 Excessive and frequent menstruation with irregular cycle: Secondary | ICD-10-CM | POA: Diagnosis not present

## 2022-01-08 DIAGNOSIS — R32 Unspecified urinary incontinence: Secondary | ICD-10-CM | POA: Diagnosis not present

## 2022-01-23 DIAGNOSIS — Z0000 Encounter for general adult medical examination without abnormal findings: Secondary | ICD-10-CM | POA: Diagnosis not present

## 2022-01-27 ENCOUNTER — Encounter: Payer: Self-pay | Admitting: Nurse Practitioner

## 2022-01-27 ENCOUNTER — Ambulatory Visit (INDEPENDENT_AMBULATORY_CARE_PROVIDER_SITE_OTHER): Payer: Federal, State, Local not specified - PPO | Admitting: Nurse Practitioner

## 2022-01-27 VITALS — BP 160/100 | HR 80 | Temp 97.1°F | Ht 65.0 in | Wt 210.2 lb

## 2022-01-27 DIAGNOSIS — Z23 Encounter for immunization: Secondary | ICD-10-CM | POA: Diagnosis not present

## 2022-01-27 DIAGNOSIS — Z136 Encounter for screening for cardiovascular disorders: Secondary | ICD-10-CM

## 2022-01-27 DIAGNOSIS — Z1211 Encounter for screening for malignant neoplasm of colon: Secondary | ICD-10-CM

## 2022-01-27 DIAGNOSIS — Z13220 Encounter for screening for lipoid disorders: Secondary | ICD-10-CM | POA: Diagnosis not present

## 2022-01-27 DIAGNOSIS — Z0001 Encounter for general adult medical examination with abnormal findings: Secondary | ICD-10-CM | POA: Diagnosis not present

## 2022-01-27 DIAGNOSIS — R030 Elevated blood-pressure reading, without diagnosis of hypertension: Secondary | ICD-10-CM | POA: Diagnosis not present

## 2022-01-27 LAB — COMPREHENSIVE METABOLIC PANEL
ALT: 13 U/L (ref 0–35)
AST: 16 U/L (ref 0–37)
Albumin: 4 g/dL (ref 3.5–5.2)
Alkaline Phosphatase: 42 U/L (ref 39–117)
BUN: 10 mg/dL (ref 6–23)
CO2: 25 mEq/L (ref 19–32)
Calcium: 9.1 mg/dL (ref 8.4–10.5)
Chloride: 103 mEq/L (ref 96–112)
Creatinine, Ser: 0.78 mg/dL (ref 0.40–1.20)
GFR: 91.05 mL/min (ref >60.00)
Glucose, Bld: 87 mg/dL (ref 70–99)
Potassium: 3.8 mEq/L (ref 3.5–5.1)
Sodium: 137 mEq/L (ref 135–145)
Total Bilirubin: 0.4 mg/dL (ref 0.2–1.2)
Total Protein: 7.2 g/dL (ref 6.0–8.3)

## 2022-01-27 LAB — CBC
HCT: 33.5 % — ABNORMAL LOW (ref 36.0–46.0)
Hemoglobin: 10.9 g/dL — ABNORMAL LOW (ref 12.0–15.0)
MCHC: 32.4 g/dL (ref 30.0–36.0)
MCV: 83.3 fl (ref 78.0–100.0)
Platelets: 298 10*3/uL (ref 150.0–400.0)
RBC: 4.03 Mil/uL (ref 3.87–5.11)
RDW: 14.1 % (ref 11.5–15.5)
WBC: 7.2 10*3/uL (ref 4.0–10.5)

## 2022-01-27 LAB — LIPID PANEL
Cholesterol: 229 mg/dL — ABNORMAL HIGH (ref 0–200)
HDL: 66 mg/dL (ref >39.00)
LDL Cholesterol: 147 mg/dL — ABNORMAL HIGH (ref 0–99)
NonHDL: 163.02
Total CHOL/HDL Ratio: 3
Triglycerides: 82 mg/dL (ref 0.0–149.0)
VLDL: 16.4 mg/dL (ref 0.0–40.0)

## 2022-01-27 NOTE — Patient Instructions (Signed)
Monitor BP at home in Am Send BP reading via mychart on Friday Maintain DASH diet You will be contacted to schedule appt with GI Have report from GYN faxed to me Go to lab  [place] stands for Dietary Approaches to Stop Hypertension. The DASH eating plan is a healthy eating plan that has been shown to: Reduce high blood pressure (hypertension). Reduce your risk for type 2 diabetes, heart disease, and stroke. Help with weight loss. What are tips for following this plan? Reading food labels Check food labels for the amount of salt (sodium) per serving. Choose foods with less than 5 percent of the Daily Value of sodium. Generally, foods with less than 300 milligrams (mg) of sodium per serving fit into this eating plan. To find whole grains, look for the word "whole" as the first word in the ingredient list. Shopping Buy products labeled as "low-sodium" or "no salt added." Buy fresh foods. Avoid canned foods and pre-made or frozen meals. Cooking Avoid adding salt when cooking. Use salt-free seasonings or herbs instead of table salt or sea salt. Check with your health care provider or pharmacist before using salt substitutes. Do not fry foods. Cook foods using healthy methods such as baking, boiling, grilling, roasting, and broiling instead. Cook with heart-healthy oils, such as olive, canola, avocado, soybean, or sunflower oil. Meal planning  Eat a balanced diet that includes: 4 or more servings of fruits and 4 or more servings of vegetables each day. Try to fill one-half of your plate with fruits and vegetables. 6-8 servings of whole grains each day. Less than 6 oz (170 g) of lean meat, poultry, or fish each day. A 3-oz (85-g) serving of meat is about the same size as a deck of cards. One egg equals 1 oz (28 g). 2-3 servings of low-fat dairy each day. One serving is 1 cup (237 mL). 1 serving of nuts, seeds, or beans 5 times each week. 2-3 servings of heart-healthy fats.  Healthy fats called omega-3 fatty acids are found in foods such as walnuts, flaxseeds, fortified milks, and eggs. These fats are also found in cold-water fish, such as sardines, salmon, and mackerel. Limit how much you eat of: Canned or prepackaged foods. Food that is high in trans fat, such as some fried foods. Food that is high in saturated fat, such as fatty meat. Desserts and other sweets, sugary drinks, and other foods with added sugar. Full-fat dairy products. Do not salt foods before eating. Do not eat more than 4 egg yolks a week. Try to eat at least 2 vegetarian meals a week. Eat more home-cooked food and less restaurant, buffet, and fast food. Lifestyle When eating at a restaurant, ask that your food be prepared with less salt or no salt, if possible. If you drink alcohol: Limit how much you use to: 0-1 drink a day for women who are not pregnant. 0-2 drinks a day for men. Be aware of how much alcohol is in your drink. In the [place] one drink equals one 12 oz bottle of beer (355 mL), one 5 oz glass of wine (148 mL), or one 1 oz glass of hard liquor (44 mL). General information Avoid eating more than 2,300 mg of salt a day. If you have hypertension, you may need to reduce your sodium intake to 1,500 mg a day. Work with your health care provider to maintain a healthy body weight or to lose weight. Ask what an ideal weight is for you. Get  at least 30 minutes of exercise that causes your heart to beat faster (aerobic exercise) most days of the week. Activities may include walking, swimming, or biking. Work with your health care provider or dietitian to adjust your eating plan to your individual calorie needs. What foods should I eat? Fruits All fresh, dried, or frozen fruit. Canned fruit in natural juice (without added sugar). Vegetables Fresh or frozen vegetables (raw, steamed, roasted, or grilled). Low-sodium or reduced-sodium tomato and vegetable juice. Low-sodium or reduced-sodium  tomato sauce and tomato paste. Low-sodium or reduced-sodium canned vegetables. Grains Whole-grain or whole-wheat bread. Whole-grain or whole-wheat pasta. Brown rice. [place]. Bulgur. Whole-grain and low-sodium cereals. Pita bread. Low-fat, low-sodium crackers. Whole-wheat flour tortillas. Meats and other proteins Skinless chicken or [place]. Ground chicken or [place]. Pork with fat trimmed off. Fish and seafood. Egg whites. Dried beans, peas, or lentils. Unsalted nuts, nut butters, and seeds. Unsalted canned beans. Lean cuts of beef with fat trimmed off. Low-sodium, lean precooked or cured meat, such as sausages or meat loaves. Dairy Low-fat (1%) or fat-free (skim) milk. Reduced-fat, low-fat, or fat-free cheeses. Nonfat, low-sodium ricotta or cottage cheese. Low-fat or nonfat yogurt. Low-fat, low-sodium cheese. Fats and oils Soft margarine without trans fats. Vegetable oil. Reduced-fat, low-fat, or light mayonnaise and salad dressings (reduced-sodium). Canola, safflower, olive, avocado, soybean, and sunflower oils. Avocado. Seasonings and condiments Herbs. Spices. Seasoning mixes without salt. Other foods Unsalted popcorn and pretzels. Fat-free sweets. The items listed above may not be a complete list of foods and beverages you can eat. Contact a dietitian for more information. What foods should I avoid? Fruits Canned fruit in a light or heavy syrup. Fried fruit. Fruit in cream or butter sauce. Vegetables Creamed or fried vegetables. Vegetables in a cheese sauce. Regular canned vegetables (not low-sodium or reduced-sodium). Regular canned tomato sauce and paste (not low-sodium or reduced-sodium). Regular tomato and vegetable juice (not low-sodium or reduced-sodium). Angie Fava. Olives. Grains Baked goods made with fat, such as croissants, muffins, or some breads. Dry pasta or rice meal packs. Meats and other proteins Fatty cuts of meat. Ribs. Fried meat. Berniece Salines. Bologna, salami, and other  precooked or cured meats, such as sausages or meat loaves. Fat from the back of a pig (fatback). Bratwurst. Salted nuts and seeds. Canned beans with added salt. Canned or smoked fish. Whole eggs or egg yolks. Chicken or [place] with skin. Dairy Whole or 2% milk, cream, and half-and-half. Whole or full-fat cream cheese. Whole-fat or sweetened yogurt. Full-fat cheese. Nondairy creamers. Whipped toppings. Processed cheese and cheese spreads. Fats and oils Butter. Stick margarine. Lard. Shortening. Ghee. Bacon fat. Tropical oils, such as coconut, palm kernel, or palm oil. Seasonings and condiments Onion salt, garlic salt, seasoned salt, table salt, and sea salt. Worcestershire sauce. Tartar sauce. Barbecue sauce. Teriyaki sauce. Soy sauce, including reduced-sodium. Steak sauce. Canned and packaged gravies. Fish sauce. Oyster sauce. Cocktail sauce. Store-bought horseradish. Ketchup. Mustard. Meat flavorings and tenderizers. Bouillon cubes. Hot sauces. Pre-made or packaged marinades. Pre-made or packaged taco seasonings. Relishes. Regular salad dressings. Other foods Salted popcorn and pretzels. The items listed above may not be a complete list of foods and beverages you should avoid. Contact a dietitian for more information. Where to find more information National Heart, Lung, and Blood Institute: https://wilson-eaton.com/ American Heart Association: www.heart.org Academy of Nutrition and Dietetics: www.eatright.[place]: www.kidney.org Summary The DASH eating plan is a healthy eating plan that has been shown to reduce high blood pressure (hypertension). It may also reduce  your risk for type 2 diabetes, heart disease, and stroke. When on the DASH eating plan, aim to eat more fresh fruits and vegetables, whole grains, lean proteins, low-fat dairy, and heart-healthy fats. With the DASH eating plan, you should limit salt (sodium) intake to 2,300 mg a day. If you have hypertension, you may need  to reduce your sodium intake to 1,500 mg a day. Work with your health care provider or dietitian to adjust your eating plan to your individual calorie needs. This information is not intended to replace advice given to you by your health care provider. Make sure you discuss any questions you have with your health care provider. Document Revised: 05/12/2019 Document Reviewed: 05/12/2019 Elsevier Patient Education  [place].

## 2022-01-27 NOTE — Progress Notes (Signed)
Complete physical exam  Patient: Jordan [name]   DOB: May 31, 1976   46 y.o. Female  MRN: [id number] Visit Date: 01/27/2022  Subjective:    Chief Complaint  Patient presents with   Annual Exam    PE Pt fasting No concerns  Tdap given today  Requesting records for pap Has a well women appt scheduled    Jordan [name] is a 46 y.o. female who presents today for a complete physical exam. She reports consuming a general diet.  none  She generally feels well. She reports sleeping well. She does not have additional problems to discuss today.  Vision:Yes Dental:Yes STD Screen:No  BP Readings from Last 3 Encounters:  01/27/22 (!) 160/100  11/11/21 130/88  12/18/20 132/84    Most recent fall risk assessment:    01/27/2022    1:05 PM  [place] in the past year? 0  Number falls in past yr: 0  Injury with Fall? 0   Most recent depression screenings:    01/27/2022    1:54 PM 05/03/2019    8:48 AM  PHQ 2/9 Scores  PHQ - 2 Score 0 0  PHQ- 9 Score 2    HPI  No problem-specific Assessment & Plan notes found for this encounter.   Past Medical History:  Diagnosis Date   Complication of anesthesia 2009   failed epidural, severe pain from second attempt   HSV-2 infection    Hypothyroid    graves   Past Surgical History:  Procedure Laterality Date   CESAREAN SECTION     DILATION AND CURETTAGE OF UTERUS  1995   TAB   HYSTEROSCOPY  03/21/2012   Procedure: HYSTEROSCOPY;  Surgeon: Marvene Staff, MD;  Location: [facility];  Service: Gynecology;;   IUD REMOVAL  03/21/2012   Procedure: INTRAUTERINE DEVICE (IUD) REMOVAL;  Surgeon: Marvene Staff, MD;  Location: [facility];  Service: Gynecology;  Laterality: N/A;   LAPAROSCOPIC TUBAL LIGATION  03/21/2012   Procedure: LAPAROSCOPIC TUBAL LIGATION;  Surgeon: Marvene Staff, MD;  Location: [facility];  Service: Gynecology;  Laterality: Bilateral;  with cautery   SUPERFICIAL LYMPH NODE BIOPSY / EXCISION     TUBAL  LIGATION     UTERINE SUSPENSION  2007   Social History   Socioeconomic History   Marital status: Married    Spouse name: Not on file   Number of children: 2   Years of education: Not on file   Highest education level: Not on file  Occupational History   Not on file  Tobacco Use   Smoking status: Never   Smokeless tobacco: Never  Vaping Use   Vaping Use: Never used  Substance and Sexual Activity   Alcohol use: Yes    Comment: once every 2 mths   Drug use: No   Sexual activity: Yes    Partners: Male    Birth control/protection: Surgical    Comment: tubal ligation  Other Topics Concern   Not on file  Social History Narrative   Not on file   Social Determinants of Health   Financial Resource Strain: Not on file  Food Insecurity: Not on file  Transportation Needs: Not on file  Physical Activity: Not on file  Stress: Not on file  Social Connections: Not on file  Intimate Partner Violence: Not on file   Family Status  Relation Name Status   Mother  Alive   Father  Alive   Brother  (Not Specified)  Mat Aunt  (Not Specified)   Jordan [name]  (Not Specified)   Jordan [name]  (Not Specified)   MGM  (Not Specified)   PGM  (Not Specified)   Family History  Problem Relation Age of Onset   Hypertension Mother    Allergies Mother    Rheum arthritis Mother    Hypertension Father    Allergies Father    Thyroid disease Brother    Breast cancer Maternal Aunt 28   Breast cancer Paternal Aunt 1   Thyroid disease Paternal Aunt    Thyroid disease Paternal Aunt    Stroke Maternal Grandmother    Stroke Paternal Grandmother    Thyroid disease Paternal Grandmother    No Known Allergies  Patient Care Team: Muhsin Doris, Charlene Brooke, NP as PCP - General (Internal Medicine) Servando Salina, MD as Consulting Physician (Obstetrics and Gynecology)   Medications: Outpatient Medications Prior to Visit  Medication Sig   cetirizine (ZYRTEC) 10 MG tablet Take 10 mg by mouth daily.    Multiple Vitamins-Minerals (MULTIVITAMIN PO) Take by mouth.   SYNTHROID 150 MCG tablet Take 1 tablet (150 mcg total) by mouth daily before breakfast.   [DISCONTINUED] valACYclovir (VALTREX) 1000 MG tablet Take one tablet twice daily for 3 days at onset of outbreak (Patient not taking: Reported on 01/27/2022)   No facility-administered medications prior to visit.    Review of Systems  Constitutional:  Negative for fever.  HENT:  Negative for congestion and sore throat.   Eyes:        Negative for visual changes  Respiratory:  Negative for cough and shortness of breath.   Cardiovascular:  Negative for chest pain, palpitations and leg swelling.  Gastrointestinal:  Negative for blood in stool, constipation and diarrhea.  Genitourinary:  Negative for dysuria, frequency and urgency.  Musculoskeletal:  Negative for myalgias.  Skin:  Negative for rash.  Neurological:  Negative for dizziness and headaches.  Hematological:  Does not bruise/bleed easily.  Psychiatric/Behavioral:  Negative for suicidal ideas. The patient is not nervous/anxious.         Objective:  BP (!) 160/100 (BP Location: Left Arm, Patient Position: Sitting, Cuff Size: Normal)   Pulse 80   Temp (!) 97.1 F (36.2 C) (Temporal)   Ht '5\' 5"'$  (1.651 m)   Wt 210 lb 3.2 oz (95.3 kg)   SpO2 98%   BMI 34.98 kg/m       Physical Exam Vitals reviewed.  Constitutional:      General: She is not in acute distress.    Appearance: She is well-developed. She is obese.  HENT:     Right Ear: Tympanic membrane, ear canal and external ear normal.     Left Ear: Tympanic membrane, ear canal and external ear normal.     Nose: Nose normal.     Mouth/Throat:     Pharynx: No oropharyngeal exudate.  Eyes:     Conjunctiva/sclera: Conjunctivae normal.     Pupils: Pupils are equal, round, and reactive to light.  Cardiovascular:     Rate and Rhythm: Normal rate and regular rhythm.     Pulses: Normal pulses.     Heart sounds: Normal heart  sounds.  Pulmonary:     Effort: Pulmonary effort is normal. No respiratory distress.     Breath sounds: Normal breath sounds.  Chest:     Chest wall: No tenderness.  Abdominal:     General: Bowel sounds are normal.     Palpations: Abdomen is soft.  Musculoskeletal:        General: Normal range of motion.     Cervical back: Normal range of motion and neck supple.     Right lower leg: No edema.     Left lower leg: No edema.  Skin:    General: Skin is warm and dry.  Neurological:     Mental Status: She is alert and oriented to person, place, and time.     Deep Tendon Reflexes: Reflexes are normal and symmetric.  Psychiatric:        Mood and Affect: Mood normal.        Behavior: Behavior normal.        Thought Content: Thought content normal.     No results found for any visits on 01/27/22.    Assessment & Plan:    Routine Health Maintenance and Physical Exam  Immunization History  Administered Date(s) Administered   Influenza Whole 03/22/2017   Influenza,inj,Quad PF,6+ Mos 04/08/2016, 04/12/2017, 03/22/2018   Influenza-Unspecified 02/21/2019   PFIZER(Purple Top)SARS-COV-2 Vaccination 09/04/2019   Tdap 07/06/2007, 01/27/2022   Health Maintenance  Topic Date Due   HIV Screening  Never done   Hepatitis C Screening  Never done   COLONOSCOPY (Pts 45-70yr Insurance coverage will need to be confirmed)  Never done   PAP SMEAR-Modifier  01/20/2021   INFLUENZA VACCINE  01/20/2022   COVID-19 Vaccine (2 - Pfizer series) 02/12/2022 (Originally 10/30/2019)   TETANUS/TDAP  01/28/2032   HPV VACCINES  Aged Out   Discussed health benefits of physical activity, and encouraged her to engage in regular exercise appropriate for her age and condition. Monitor BP at home in Am Send BP reading via mychart on Friday Maintain DASH diet You will be contacted to schedule appt with GI Have report from GYN faxed to me  Problem List Items Addressed This Visit   None Visit Diagnoses      Preventative health care    -  Primary   Relevant Orders   CBC   Comprehensive metabolic panel   Lipid panel   Ambulatory referral to Gastroenterology   Encounter for lipid screening for cardiovascular disease       Relevant Orders   Lipid panel   Colon cancer screening       Relevant Orders   Ambulatory referral to Gastroenterology   Elevated BP without diagnosis of hypertension       Need for Tdap vaccination       Relevant Orders   Tdap vaccine greater than or equal to 7yo IM (Completed)      Return in about 1 year (around 01/28/2023) for CPE (fasting).     CWilfred Lacy NP

## 2022-01-28 DIAGNOSIS — Z01419 Encounter for gynecological examination (general) (routine) without abnormal findings: Secondary | ICD-10-CM | POA: Diagnosis not present

## 2022-01-30 ENCOUNTER — Encounter: Payer: Self-pay | Admitting: Nurse Practitioner

## 2022-02-05 DIAGNOSIS — Z0289 Encounter for other administrative examinations: Secondary | ICD-10-CM

## 2022-02-09 ENCOUNTER — Other Ambulatory Visit: Payer: Self-pay | Admitting: Internal Medicine

## 2022-02-09 DIAGNOSIS — M6281 Muscle weakness (generalized): Secondary | ICD-10-CM | POA: Diagnosis not present

## 2022-02-09 DIAGNOSIS — N3946 Mixed incontinence: Secondary | ICD-10-CM | POA: Diagnosis not present

## 2022-02-09 DIAGNOSIS — R278 Other lack of coordination: Secondary | ICD-10-CM | POA: Diagnosis not present

## 2022-02-09 DIAGNOSIS — E890 Postprocedural hypothyroidism: Secondary | ICD-10-CM

## 2022-02-09 MED ORDER — SYNTHROID 137 MCG PO TABS: 137.0000 ug | ORAL_TABLET | Freq: Every day | ORAL | 3 refills | Status: DC

## 2022-02-17 ENCOUNTER — Ambulatory Visit (AMBULATORY_SURGERY_CENTER): Payer: Federal, State, Local not specified - PPO | Admitting: *Deleted

## 2022-02-17 VITALS — Ht 65.0 in | Wt 208.2 lb

## 2022-02-17 DIAGNOSIS — Z1211 Encounter for screening for malignant neoplasm of colon: Secondary | ICD-10-CM

## 2022-02-17 MED ORDER — NA SULFATE-K SULFATE-MG SULF 17.5-3.13-1.6 GM/177ML PO SOLN: 1.0000 | Freq: Once | ORAL | 0 refills | Status: AC

## 2022-02-17 NOTE — Progress Notes (Signed)
  No trouble with anesthesia, denies being told they were difficult to intubate, or hx/fam hx of malignant hyperthermia per pt   No egg or soy allergy  No home oxygen use   No medications for weight loss taken  Pt denies constipation issues  Pt informed that we do not do prior authorizations for prep  

## 2022-03-04 DIAGNOSIS — R278 Other lack of coordination: Secondary | ICD-10-CM | POA: Diagnosis not present

## 2022-03-04 DIAGNOSIS — M6281 Muscle weakness (generalized): Secondary | ICD-10-CM | POA: Diagnosis not present

## 2022-03-04 DIAGNOSIS — N3946 Mixed incontinence: Secondary | ICD-10-CM | POA: Diagnosis not present

## 2022-03-10 ENCOUNTER — Encounter: Payer: Self-pay | Admitting: Internal Medicine

## 2022-03-10 ENCOUNTER — Ambulatory Visit (AMBULATORY_SURGERY_CENTER): Payer: Federal, State, Local not specified - PPO | Admitting: Internal Medicine

## 2022-03-10 VITALS — BP 137/87 | HR 71 | Temp 96.8°F | Resp 16 | Ht 65.0 in | Wt 208.2 lb

## 2022-03-10 DIAGNOSIS — Z1211 Encounter for screening for malignant neoplasm of colon: Secondary | ICD-10-CM

## 2022-03-10 DIAGNOSIS — K635 Polyp of colon: Secondary | ICD-10-CM | POA: Diagnosis not present

## 2022-03-10 DIAGNOSIS — D123 Benign neoplasm of transverse colon: Secondary | ICD-10-CM

## 2022-03-10 HISTORY — PX: COLONOSCOPY: SHX174

## 2022-03-10 MED ORDER — SODIUM CHLORIDE 0.9 % IV SOLN: 500.0000 mL | Freq: Once | INTRAVENOUS | Status: DC

## 2022-03-10 NOTE — Progress Notes (Signed)
Called to room to assist during endoscopic procedure.  Patient ID and intended procedure confirmed with present staff. Received instructions for my participation in the procedure from the performing physician.  

## 2022-03-10 NOTE — Progress Notes (Signed)
GASTROENTEROLOGY PROCEDURE H&P NOTE   Primary Care Physician: Flossie Buffy, NP    Reason for Procedure:   Colon cancer screening  Plan:    Colonoscopy  Patient is appropriate for endoscopic procedure(s) in the ambulatory ([place]) setting.  The nature of the procedure, as well as the risks, benefits, and alternatives were carefully and thoroughly reviewed with the patient. Ample time for discussion and questions allowed. The patient understood, was satisfied, and agreed to proceed.     HPI: Jordan [name] is a 46 y.o. female who presents for colonoscopy for colon cancer screening. Denies blood in stools, changes in bowel habits, weight loss. Denies family history of colon cancer.  Past Medical History:  Diagnosis Date   Allergy    Anemia    Complication of anesthesia 06/23/2007   failed epidural, severe pain from second attempt   Heart murmur    HSV-2 infection    Hyperlipidemia    no meds   Hypertension    no meds   Hypothyroid    graves    Past Surgical History:  Procedure Laterality Date   CESAREAN SECTION     COLONOSCOPY  03/10/2022   DILATION AND CURETTAGE OF UTERUS  06/22/1993   TAB   HYSTEROSCOPY  03/21/2012   Procedure: HYSTEROSCOPY;  Surgeon: Marvene Staff, MD;  Location: [facility];  Service: Gynecology;;   IUD REMOVAL  03/21/2012   Procedure: INTRAUTERINE DEVICE (IUD) REMOVAL;  Surgeon: Marvene Staff, MD;  Location: [facility];  Service: Gynecology;  Laterality: N/A;   LAPAROSCOPIC TUBAL LIGATION  03/21/2012   Procedure: LAPAROSCOPIC TUBAL LIGATION;  Surgeon: Marvene Staff, MD;  Location: [facility];  Service: Gynecology;  Laterality: Bilateral;  with cautery   SUPERFICIAL LYMPH NODE BIOPSY / EXCISION     TUBAL LIGATION     UTERINE SUSPENSION  06/22/2005    Prior to Admission medications   Medication Sig Start Date End Date Taking? Authorizing Provider  Multiple Vitamins-Minerals (MULTIVITAMIN PO) Take by mouth.   Yes [provider]  SYNTHROID 137 MCG tablet Take 1 tablet (137 mcg total) by mouth daily before breakfast. 02/09/22  Yes Philemon Kingdom, MD  cetirizine (ZYRTEC) 10 MG tablet Take 10 mg by mouth daily. Takes PRN    [provider]    Current Outpatient Medications  Medication Sig Dispense Refill   Multiple Vitamins-Minerals (MULTIVITAMIN PO) Take by mouth.     SYNTHROID 137 MCG tablet Take 1 tablet (137 mcg total) by mouth daily before breakfast. 45 tablet 3   cetirizine (ZYRTEC) 10 MG tablet Take 10 mg by mouth daily. Takes PRN     Current Facility-Administered Medications  Medication Dose Route Frequency Provider Last Rate Last Admin   0.9 %  sodium chloride infusion  500 mL Intravenous Once Sharyn Creamer, MD        Allergies as of 03/10/2022   (No Known Allergies)    Family History  Problem Relation Age of Onset   Hypertension Mother    Allergies Mother    Rheum arthritis Mother    Hypertension Father    Allergies Father    Thyroid disease Brother    Breast cancer Maternal Aunt 54   Breast cancer Paternal Aunt 25   Thyroid disease Paternal Aunt    Thyroid disease Paternal Aunt    Stroke Maternal Grandmother    Stroke Paternal Grandmother    Thyroid disease Paternal Grandmother    Colon cancer Neg Hx  Esophageal cancer Neg Hx    Stomach cancer Neg Hx    Rectal cancer Neg Hx     Social History   Socioeconomic History   Marital status: Married    Spouse name: Not on file   Number of children: 2   Years of education: Not on file   Highest education level: Not on file  Occupational History   Not on file  Tobacco Use   Smoking status: Never   Smokeless tobacco: Never  Vaping Use   Vaping Use: Never used  Substance and Sexual Activity   Alcohol use: Yes    Comment: once every 2 mths   Drug use: No   Sexual activity: Yes    Partners: Male    Birth control/protection: Surgical    Comment: tubal ligation  Other Topics Concern   Not on file  Social  History Narrative   Not on file   Social Determinants of Health   Financial Resource Strain: Not on file  Food Insecurity: Not on file  Transportation Needs: Not on file  Physical Activity: Not on file  Stress: Not on file  Social Connections: Not on file  Intimate Partner Violence: Not on file    Physical Exam: Vital signs in last 24 hours: BP (!) 154/90   Pulse 80   Temp (!) 96.8 F (36 C) (Temporal)   Resp 20   Ht '5\' 5"'$  (1.651 m)   Wt 208 lb 3.2 oz (94.4 kg)   LMP 02/03/2022 Comment: tubal ligation  SpO2 100%   BMI 34.65 kg/m  GEN: NAD EYE: Sclerae anicteric ENT: MMM CV: Non-tachycardic Pulm: No increased work of breathing GI: Soft, NT/ND NEURO:  Alert & Oriented   Christia Reading, MD [facility]  03/10/2022 8:12 AM

## 2022-03-10 NOTE — Progress Notes (Signed)
Pt's states no medical or surgical changes since previsit or office visit. 

## 2022-03-10 NOTE — Progress Notes (Signed)
To pacu, VSS. Report to rn,tb

## 2022-03-10 NOTE — Patient Instructions (Signed)
Discharge instructions given. Handouts on polyps and Hemorrhoids. Resume previous medications. YOU HAD AN ENDOSCOPIC PROCEDURE TODAY AT THE [facility]:   Refer to the procedure report that was given to you for any specific questions about what was found during the examination.  If the procedure report does not answer your questions, please call your gastroenterologist to clarify.  If you requested that your care partner not be given the details of your procedure findings, then the procedure report has been included in a sealed envelope for you to review at your convenience later.  YOU SHOULD EXPECT: Some feelings of bloating in the abdomen. Passage of more gas than usual.  Walking can help get rid of the air that was put into your GI tract during the procedure and reduce the bloating. If you had a lower endoscopy (such as a colonoscopy or flexible sigmoidoscopy) you may notice spotting of blood in your stool or on the toilet paper. If you underwent a bowel prep for your procedure, you may not have a normal bowel movement for a few days.  Please Note:  You might notice some irritation and congestion in your nose or some drainage.  This is from the oxygen used during your procedure.  There is no need for concern and it should clear up in a day or so.  SYMPTOMS TO REPORT IMMEDIATELY:  Following lower endoscopy (colonoscopy or flexible sigmoidoscopy):  Excessive amounts of blood in the stool  Significant tenderness or worsening of abdominal pains  Swelling of the abdomen that is new, acute  Fever of 100F or higher   For urgent or emergent issues, a gastroenterologist can be reached at any hour by calling [phone]. Do not use MyChart messaging for urgent concerns.    DIET:  We do recommend a small meal at first, but then you may proceed to your regular diet.  Drink plenty of fluids but you should avoid alcoholic beverages for 24 hours.  ACTIVITY:  You should plan to take it  easy for the rest of today and you should NOT DRIVE or use heavy machinery until tomorrow (because of the sedation medicines used during the test).    FOLLOW UP: Our staff will call the number listed on your records the next business day following your procedure.  We will call around 7:15- 8:00 am to check on you and address any questions or concerns that you may have regarding the information given to you following your procedure. If we do not reach you, we will leave a message.     If any biopsies were taken you will be contacted by phone or by letter within the next 1-3 weeks.  Please call us at [phone] if you have not heard about the biopsies in 3 weeks.    SIGNATURES/CONFIDENTIALITY: You and/or your care partner have signed paperwork which will be entered into your electronic medical record.  These signatures attest to the fact that that the information above on your After Visit Summary has been reviewed and is understood.  Full responsibility of the confidentiality of this discharge information lies with you and/or your care-partner. 

## 2022-03-10 NOTE — Op Note (Signed)
[facility] Patient Name: Jordan [name] Procedure Date: 03/10/2022 8:02 AM MRN: [id number] Endoscopist: Jordan [name] "Jordan [name] ,  Age: 46 Referring MD:  Date of Birth: [birth year] Gender: Female Account #: [id number] Procedure:                Colonoscopy Indications:              Screening for colorectal malignant neoplasm, This                            is the patient's first colonoscopy Medicines:                Monitored Anesthesia Care Procedure:                Pre-Anesthesia Assessment:                           - Prior to the procedure, a History and Physical                            was performed, and patient medications and                            allergies were reviewed. The patient's tolerance of                            previous anesthesia was also reviewed. The risks                            and benefits of the procedure and the sedation                            options and risks were discussed with the patient.                            All questions were answered, and informed consent                            was obtained. Prior Anticoagulants: The patient has                            taken no previous anticoagulant or antiplatelet                            agents except for aspirin. ASA Grade Assessment: II                            - A patient with mild systemic disease. After                            reviewing the risks and benefits, the patient was                            deemed in satisfactory condition to undergo the  procedure.                           After obtaining informed consent, the colonoscope                            was passed under direct vision. Throughout the                            procedure, the patient's blood pressure, pulse, and                            oxygen saturations were monitored continuously. The                            CF [id number] #[id number] was introduced through the anus                             and advanced to the the terminal ileum. The                            colonoscopy was performed without difficulty. The                            patient tolerated the procedure well. The quality                            of the bowel preparation was good. The terminal                            ileum, ileocecal valve, appendiceal orifice, and                            rectum were photographed. Scope In: 8:14:52 AM Scope Out: 8:35:29 AM Scope Withdrawal Time: 0 hours 12 minutes 23 seconds  Total Procedure Duration: 0 hours 20 minutes 37 seconds  Findings:                 The terminal ileum appeared normal.                           A 3 mm polyp was found in the transverse colon. The                            polyp was sessile. The polyp was removed with a                            cold snare. Resection and retrieval were complete.                           Non-bleeding internal hemorrhoids were found during                            retroflexion. Complications:            No  immediate complications. Estimated Blood Loss:     Estimated blood loss was minimal. Impression:               - The examined portion of the ileum was normal.                           - One 3 mm polyp in the transverse colon, removed                            with a cold snare. Resected and retrieved.                           - Non-bleeding internal hemorrhoids. Recommendation:           - Discharge patient to home (with escort).                           - Await pathology results.                           - The findings and recommendations were discussed                            with the patient. Dr Jordan [name] "Jordan [name]" [name],  03/10/2022 8:43:26 AM

## 2022-03-11 ENCOUNTER — Telehealth: Payer: Self-pay

## 2022-03-11 NOTE — Telephone Encounter (Signed)
[facility] Follow up Call-     03/10/2022    7:13 AM  Call back number  Post procedure Call Back phone  # [phone]  Permission to leave phone message Yes     Patient questions:  Do you have a fever, pain , or abdominal swelling? No. Pain Score  0 *  Have you tolerated food without any problems? Yes.    Have you been able to return to your normal activities? Yes.    Do you have any questions about your discharge instructions: Diet   No. Medications  No. Follow up visit  No.  Do you have questions or concerns about your Care? No.  Actions: * If pain score is 4 or above: No action needed, pain <4.

## 2022-03-13 DIAGNOSIS — M6281 Muscle weakness (generalized): Secondary | ICD-10-CM | POA: Diagnosis not present

## 2022-03-13 DIAGNOSIS — N3946 Mixed incontinence: Secondary | ICD-10-CM | POA: Diagnosis not present

## 2022-03-13 DIAGNOSIS — R278 Other lack of coordination: Secondary | ICD-10-CM | POA: Diagnosis not present

## 2022-03-16 ENCOUNTER — Encounter: Payer: Self-pay | Admitting: Internal Medicine

## 2022-03-25 DIAGNOSIS — M6281 Muscle weakness (generalized): Secondary | ICD-10-CM | POA: Diagnosis not present

## 2022-03-25 DIAGNOSIS — N3946 Mixed incontinence: Secondary | ICD-10-CM | POA: Diagnosis not present

## 2022-03-25 DIAGNOSIS — R278 Other lack of coordination: Secondary | ICD-10-CM | POA: Diagnosis not present

## 2022-04-09 DIAGNOSIS — Z23 Encounter for immunization: Secondary | ICD-10-CM | POA: Diagnosis not present

## 2022-04-27 DIAGNOSIS — E7800 Pure hypercholesterolemia, unspecified: Secondary | ICD-10-CM | POA: Diagnosis not present

## 2022-04-27 DIAGNOSIS — E039 Hypothyroidism, unspecified: Secondary | ICD-10-CM | POA: Diagnosis not present

## 2022-04-27 DIAGNOSIS — N951 Menopausal and female climacteric states: Secondary | ICD-10-CM | POA: Diagnosis not present

## 2022-04-27 DIAGNOSIS — Z131 Encounter for screening for diabetes mellitus: Secondary | ICD-10-CM | POA: Diagnosis not present

## 2022-04-27 DIAGNOSIS — E669 Obesity, unspecified: Secondary | ICD-10-CM | POA: Diagnosis not present

## 2022-04-27 DIAGNOSIS — D649 Anemia, unspecified: Secondary | ICD-10-CM | POA: Diagnosis not present

## 2022-04-27 DIAGNOSIS — E559 Vitamin D deficiency, unspecified: Secondary | ICD-10-CM | POA: Diagnosis not present

## 2022-04-29 DIAGNOSIS — E559 Vitamin D deficiency, unspecified: Secondary | ICD-10-CM | POA: Diagnosis not present

## 2022-04-29 DIAGNOSIS — E611 Iron deficiency: Secondary | ICD-10-CM | POA: Diagnosis not present

## 2022-04-29 DIAGNOSIS — E039 Hypothyroidism, unspecified: Secondary | ICD-10-CM | POA: Diagnosis not present

## 2022-04-29 DIAGNOSIS — Z1331 Encounter for screening for depression: Secondary | ICD-10-CM | POA: Diagnosis not present

## 2022-04-29 DIAGNOSIS — E7800 Pure hypercholesterolemia, unspecified: Secondary | ICD-10-CM | POA: Diagnosis not present

## 2022-04-29 DIAGNOSIS — Z1339 Encounter for screening examination for other mental health and behavioral disorders: Secondary | ICD-10-CM | POA: Diagnosis not present

## 2022-05-04 DIAGNOSIS — E559 Vitamin D deficiency, unspecified: Secondary | ICD-10-CM | POA: Diagnosis not present

## 2022-05-04 DIAGNOSIS — E039 Hypothyroidism, unspecified: Secondary | ICD-10-CM | POA: Diagnosis not present

## 2022-05-04 DIAGNOSIS — E7800 Pure hypercholesterolemia, unspecified: Secondary | ICD-10-CM | POA: Diagnosis not present

## 2022-05-04 DIAGNOSIS — E611 Iron deficiency: Secondary | ICD-10-CM | POA: Diagnosis not present

## 2022-05-22 ENCOUNTER — Other Ambulatory Visit (INDEPENDENT_AMBULATORY_CARE_PROVIDER_SITE_OTHER): Payer: Self-pay

## 2022-05-22 DIAGNOSIS — E890 Postprocedural hypothyroidism: Secondary | ICD-10-CM

## 2022-05-22 LAB — TSH: TSH: 6.98 u[IU]/mL — ABNORMAL HIGH (ref 0.35–5.50)

## 2022-05-22 LAB — T4, FREE: Free T4: 0.84 ng/dL (ref 0.60–1.60)

## 2022-05-28 DIAGNOSIS — U071 COVID-19: Secondary | ICD-10-CM | POA: Diagnosis not present

## 2022-05-28 DIAGNOSIS — R051 Acute cough: Secondary | ICD-10-CM | POA: Diagnosis not present

## 2022-05-28 DIAGNOSIS — R519 Headache, unspecified: Secondary | ICD-10-CM | POA: Diagnosis not present

## 2022-05-29 ENCOUNTER — Other Ambulatory Visit: Payer: Self-pay

## 2022-05-29 DIAGNOSIS — E890 Postprocedural hypothyroidism: Secondary | ICD-10-CM

## 2022-05-29 MED ORDER — LEVOTHYROXINE SODIUM 150 MCG PO TABS: 150.0000 ug | ORAL_TABLET | Freq: Every day | ORAL | 1 refills | Status: DC

## 2022-06-20 DIAGNOSIS — R3989 Other symptoms and signs involving the genitourinary system: Secondary | ICD-10-CM | POA: Diagnosis not present

## 2022-06-20 DIAGNOSIS — M549 Dorsalgia, unspecified: Secondary | ICD-10-CM | POA: Diagnosis not present

## 2022-07-03 ENCOUNTER — Other Ambulatory Visit: Payer: Self-pay

## 2022-11-09 ENCOUNTER — Ambulatory Visit: Payer: Federal, State, Local not specified - PPO | Admitting: Internal Medicine

## 2022-11-21 ENCOUNTER — Other Ambulatory Visit: Payer: Self-pay | Admitting: Internal Medicine

## 2022-11-21 DIAGNOSIS — E890 Postprocedural hypothyroidism: Secondary | ICD-10-CM

## 2023-01-04 ENCOUNTER — Encounter: Payer: Self-pay | Admitting: Internal Medicine

## 2023-01-04 ENCOUNTER — Ambulatory Visit: Payer: BLUE CROSS/BLUE SHIELD | Admitting: Internal Medicine

## 2023-01-04 VITALS — BP 124/70 | HR 82 | Ht 65.0 in | Wt 208.0 lb

## 2023-01-04 DIAGNOSIS — E890 Postprocedural hypothyroidism: Secondary | ICD-10-CM | POA: Diagnosis not present

## 2023-01-04 LAB — T4, FREE: Free T4: 0.82 ng/dL (ref 0.60–1.60)

## 2023-01-04 LAB — TSH: TSH: 1.88 u[IU]/mL (ref 0.35–5.50)

## 2023-01-04 NOTE — Progress Notes (Signed)
Subjective:     Patient ID: Jordan [name], female   DOB: [birth year], 47 y.o.   MRN: [id number]  HPI Jordan [name] is a 47 y.o. woman, returning for followup for hypothyroidism post RAI ablation for Graves' disease in 1997. Last visit 1 year and 2 months ago.  Interim history:  No complaints at today's visit.  She denies palpitations, tremors, anxiety, and also does not have weight gain or increased fatigue.  Reviewed her TFTs: Lab Results  Component Value Date   TSH 6.98 (H) 05/22/2022   TSH 0.19 (L) 11/11/2021   TSH 2.88 12/13/2020   TSH 1.21 03/23/2019   TSH 0.86 03/22/2018   TSH 2.54 02/09/2017   FREET4 0.84 05/22/2022   FREET4 1.17 11/11/2021   FREET4 0.96 12/13/2020   FREET4 1.08 03/23/2019   FREET4 1.08 03/22/2018   FREET4 1.01 02/09/2017   Pt is on Synthroid 150 mcg daily, taken: - in am - fasting - at least 30 min from b'fast - no Ca, Fe, PPIs - + Multivitamins at night - not on Biotin - probiotic - late afternoon - stopped  Pt denies: - feeling nodules in neck - hoarseness - dysphagia - choking  No FH of thyroid cancer. No h/o radiation tx to head or neck. No herbal supplements. No Biotin use. No recent steroids use.   She used to do [employer] in the past but stopped after developed bronchitis in 2019.  Review of Systems + see HPI  I reviewed pt's medications, allergies, PMH, social hx, family hx, and changes were documented in the history of present illness. Otherwise, unchanged from my initial visit note.  Past Medical History:  Diagnosis Date   Allergy    Anemia    Complication of anesthesia 06/23/2007   failed epidural, severe pain from second attempt   Heart murmur    HSV-2 infection    Hyperlipidemia    no meds   Hypertension    no meds   Hypothyroid    graves   Past Surgical History:  Procedure Laterality Date   CESAREAN SECTION     COLONOSCOPY  03/10/2022   DILATION AND CURETTAGE OF UTERUS  06/22/1993   TAB   HYSTEROSCOPY   03/21/2012   Procedure: HYSTEROSCOPY;  Surgeon: Serita Kyle, MD;  Location: [facility];  Service: Gynecology;;   IUD REMOVAL  03/21/2012   Procedure: INTRAUTERINE DEVICE (IUD) REMOVAL;  Surgeon: Serita Kyle, MD;  Location: [facility];  Service: Gynecology;  Laterality: N/A;   LAPAROSCOPIC TUBAL LIGATION  03/21/2012   Procedure: LAPAROSCOPIC TUBAL LIGATION;  Surgeon: Serita Kyle, MD;  Location: [facility];  Service: Gynecology;  Laterality: Bilateral;  with cautery   SUPERFICIAL LYMPH NODE BIOPSY / EXCISION     TUBAL LIGATION     UTERINE SUSPENSION  06/22/2005   Social History   Socioeconomic History   Marital status: Married    Spouse name: Not on file   Number of children: 2   Years of education: Not on file   Highest education level: Not on file  Occupational History   Not on file  Tobacco Use   Smoking status: Never   Smokeless tobacco: Never  Vaping Use   Vaping status: Never Used  Substance and Sexual Activity   Alcohol use: Yes    Comment: once every 2 mths   Drug use: No   Sexual activity: Yes    Partners: Male    Birth control/protection: Surgical    Comment: tubal  ligation  Other Topics Concern   Not on file  Social History Narrative   Not on file   Social Determinants of Health   Financial Resource Strain: Not on file  Food Insecurity: Not on file  Transportation Needs: Not on file  Physical Activity: Not on file  Stress: Not on file  Social Connections: Not on file  Intimate Partner Violence: Not on file   Current Outpatient Medications on File Prior to Visit  Medication Sig Dispense Refill   cetirizine (ZYRTEC) 10 MG tablet Take 10 mg by mouth daily. Takes PRN     Multiple Vitamins-Minerals (MULTIVITAMIN PO) Take by mouth.     SYNTHROID 150 MCG tablet TAKE 1 TABLET BY MOUTH DAILY BEFORE BREAKFAST. 45 tablet 1   No current facility-administered medications on file prior to visit.   No Known Allergies Family History  Problem Relation  Age of Onset   Hypertension Mother    Allergies Mother    Rheum arthritis Mother    Hypertension Father    Allergies Father    Thyroid disease Brother    Breast cancer Maternal Aunt 77   Breast cancer Paternal Aunt 74   Thyroid disease Paternal Aunt    Thyroid disease Paternal Aunt    Stroke Maternal Grandmother    Stroke Paternal Grandmother    Thyroid disease Paternal Grandmother    Colon cancer Neg Hx    Esophageal cancer Neg Hx    Stomach cancer Neg Hx    Rectal cancer Neg Hx     Objective:   Physical Exam BP 124/70   Pulse 82   Ht 5\' 5"  (1.651 m)   Wt 208 lb (94.3 kg)   SpO2 99%   BMI 34.61 kg/m   Wt Readings from Last 3 Encounters:  01/04/23 208 lb (94.3 kg)  03/10/22 208 lb 3.2 oz (94.4 kg)  02/17/22 208 lb 3.2 oz (94.4 kg)   Constitutional: overweight, in NAD Eyes:  EOMI, no exophthalmos ENT: no neck masses, no cervical lymphadenopathy Cardiovascular: RRR, No MRG Respiratory: CTA B Musculoskeletal: no deformities Skin:no rashes Neurological: no tremor with outstretched hands  Assessment:     1. Iatrogenic hypothyroidism - post ablation for Graves' disease in 1997    Plan:     Pt with longstanding, post ablative hypothyroidism, with previous variable control due to noncompliance with levothyroxine but with improved control after she started to take the medicines as prescribed.  At last visit, however, a TSH was suppressed, so we had to decrease the dose of her Synthroid from 175 to 150 mcg daily.  Repeat TSH was elevated in 05/2022: Lab Results  Component Value Date   TSH 6.98 (H) 05/22/2022  - she continues on Synthroid d.a.w. 150 mcg daily - pt feels better on this dose. - we discussed about taking the thyroid hormone every day, with water, >30 minutes before breakfast, separated by >4 hours from acid reflux medications, calcium, iron, multivitamins. Pt. is taking it correctly. - will check thyroid tests today: TSH and fT4 - If labs are abnormal, she  will need to return for repeat TFTs in 1.5 months - no signs of Graves' ophthalmopathy: No blurry vision, double vision, eye pain, chemosis - I plan to see her back in a year  Needs refills.  Component     Latest Ref Rng 01/04/2023  TSH     0.35 - 5.50 uIU/mL 1.88   T4,Free(Direct)     0.60 - 1.60 ng/dL 8.84   Excellent TFTs.  Carlus Pavlov, MD PhD [facility]

## 2023-01-04 NOTE — Patient Instructions (Addendum)
Please stop at the lab.  Please restart Synthroid 150 mcg daily.  Take the thyroid hormone every day, with water, at least 30 minutes before breakfast, separated by at least 4 hours from: - acid reflux medications - calcium - iron - multivitamins  Please return in 1 year.

## 2023-02-25 ENCOUNTER — Other Ambulatory Visit: Payer: Self-pay | Admitting: Internal Medicine

## 2023-02-25 DIAGNOSIS — E890 Postprocedural hypothyroidism: Secondary | ICD-10-CM

## 2023-03-04 ENCOUNTER — Ambulatory Visit: Payer: Federal, State, Local not specified - PPO | Admitting: Internal Medicine

## 2023-07-05 DIAGNOSIS — N938 Other specified abnormal uterine and vaginal bleeding: Secondary | ICD-10-CM | POA: Diagnosis not present

## 2023-07-05 DIAGNOSIS — Z113 Encounter for screening for infections with a predominantly sexual mode of transmission: Secondary | ICD-10-CM | POA: Diagnosis not present

## 2023-07-05 DIAGNOSIS — Z124 Encounter for screening for malignant neoplasm of cervix: Secondary | ICD-10-CM | POA: Diagnosis not present

## 2023-07-15 ENCOUNTER — Ambulatory Visit: Payer: BLUE CROSS/BLUE SHIELD | Admitting: Nurse Practitioner

## 2023-07-18 ENCOUNTER — Other Ambulatory Visit: Payer: Self-pay | Admitting: Internal Medicine

## 2023-07-18 DIAGNOSIS — E890 Postprocedural hypothyroidism: Secondary | ICD-10-CM

## 2023-07-26 ENCOUNTER — Other Ambulatory Visit: Payer: Self-pay | Admitting: Nurse Practitioner

## 2023-07-26 DIAGNOSIS — Z1231 Encounter for screening mammogram for malignant neoplasm of breast: Secondary | ICD-10-CM

## 2023-07-26 MED ORDER — SYNTHROID 150 MCG PO TABS
150.0000 ug | ORAL_TABLET | Freq: Every day | ORAL | 2 refills | Status: AC
Start: 2023-07-26 — End: ?

## 2023-07-26 NOTE — Addendum Note (Signed)
Addended by: Pollie Meyer on: 07/26/2023 11:38 AM   Modules accepted: Orders

## 2023-07-26 NOTE — Telephone Encounter (Signed)
Requested Prescriptions   Signed Prescriptions Disp Refills   SYNTHROID 150 MCG tablet 90 tablet 2    Sig: Take 1 tablet (150 mcg total) by mouth daily before breakfast. TAKE 1 TABLET BY MOUTH EVERY DAY BEFORE BREAKFAST    Authorizing Provider: Carlus Pavlov    Ordering User: Pollie Meyer

## 2023-07-28 ENCOUNTER — Ambulatory Visit
Admission: RE | Admit: 2023-07-28 | Discharge: 2023-07-28 | Disposition: A | Discharge status: Home / Self Care | Payer: BLUE CROSS/BLUE SHIELD | Source: Ambulatory Visit | Attending: Nurse Practitioner | Admitting: Nurse Practitioner

## 2023-07-28 DIAGNOSIS — Z1231 Encounter for screening mammogram for malignant neoplasm of breast: Secondary | ICD-10-CM

## 2023-08-04 ENCOUNTER — Encounter: Payer: Self-pay | Admitting: Nurse Practitioner

## 2023-08-04 ENCOUNTER — Ambulatory Visit: Payer: BC Managed Care – PPO | Admitting: Nurse Practitioner

## 2023-08-04 VITALS — BP 138/88 | HR 84 | Temp 98.4°F | Wt 210.4 lb

## 2023-08-04 DIAGNOSIS — U071 COVID-19: Secondary | ICD-10-CM

## 2023-08-04 DIAGNOSIS — J069 Acute upper respiratory infection, unspecified: Secondary | ICD-10-CM | POA: Diagnosis not present

## 2023-08-04 DIAGNOSIS — J4521 Mild intermittent asthma with (acute) exacerbation: Secondary | ICD-10-CM | POA: Diagnosis not present

## 2023-08-04 LAB — BASIC METABOLIC PANEL
BUN: 10 mg/dL (ref 6–23)
CO2: 28 meq/L (ref 19–32)
Calcium: 9.5 mg/dL (ref 8.4–10.5)
Chloride: 101 meq/L (ref 96–112)
Creatinine, Ser: 0.7 mg/dL (ref 0.40–1.20)
GFR: 102.58 mL/min (ref >60.00)
Glucose, Bld: 100 mg/dL — ABNORMAL HIGH (ref 70–99)
Potassium: 4.4 meq/L (ref 3.5–5.1)
Sodium: 135 meq/L (ref 135–145)

## 2023-08-04 LAB — POCT INFLUENZA A/B
Influenza A, POC: NEGATIVE
Influenza B, POC: NEGATIVE

## 2023-08-04 MED ORDER — NIRMATRELVIR/RITONAVIR (PAXLOVID)TABLET: 3.0000 | ORAL_TABLET | Freq: Two times a day (BID) | ORAL | 0 refills | Status: AC

## 2023-08-04 MED ORDER — PROMETHAZINE-DM 6.25-15 MG/5ML PO SYRP: 5.0000 mL | ORAL_SOLUTION | Freq: Four times a day (QID) | ORAL | 0 refills | Status: DC | PRN

## 2023-08-04 MED ORDER — ALBUTEROL SULFATE HFA 108 (90 BASE) MCG/ACT IN AERS: 2.0000 | INHALATION_SPRAY | Freq: Four times a day (QID) | RESPIRATORY_TRACT | 0 refills | Status: DC | PRN

## 2023-08-04 MED ORDER — PREDNISONE 20 MG PO TABS
ORAL_TABLET | ORAL | 0 refills | Status: AC
Start: 2023-08-04 — End: 2023-08-08

## 2023-08-04 NOTE — Patient Instructions (Signed)
Will send paxlovid prescription once I get renal function results. Encourage adequate oral hydration. Use over-the-counter  cold" medicine  such as Dayquil/Nyquil/Theraflu/Alkaseltzer for cough and sinus congestion. Use mucinex DM or Robitussin  or delsym for cough without sinus congestion  You can use plain "Tylenol" or "Advil" for fever, chills and achyness. Use cool mist humidifier at bedtime to help with nasal congestion and cough.  Cold/cough medications may have tylenol or ibuprofen or guaifenesin or dextromethophan in them, so be careful not to take beyond the recommended dose for each of these medications.  Return to office if no improvement in 1week Go to ED if Shortness of breath or fever gets worse.

## 2023-08-04 NOTE — Progress Notes (Signed)
Acute Office Visit  Subjective:    Patient ID: Jordan [name], female    DOB: December 24, 1975, 48 y.o.   MRN: [id number]  Chief Complaint  Patient presents with   Covid Positive    Fever last night of 100.0, congestion, runny nose, dizzy, chest pain, cough, body aches x yesterday.    URI  This is a new problem. The current episode started yesterday. The problem has been unchanged. The maximum temperature recorded prior to her arrival was 100.4 - 100.9 F. Associated symptoms include chest pain, congestion, coughing, headaches, joint pain, rhinorrhea, sinus pain, sneezing and wheezing. Pertinent negatives include no abdominal pain, diarrhea, dysuria, ear pain, joint swelling, nausea, neck pain, plugged ear sensation, rash, sore throat, swollen glands or vomiting. She has tried acetaminophen for the symptoms. The treatment provided mild relief.  Report exposure to COVID and influenza A in the last 1week. Positive COVID test at home. S/p tubal ligation.  Outpatient Medications Prior to Visit  Medication Sig   cetirizine (ZYRTEC) 10 MG tablet Take 10 mg by mouth daily. Takes PRN   Multiple Vitamins-Minerals (MULTIVITAMIN PO) Take by mouth.   SYNTHROID 150 MCG tablet Take 1 tablet (150 mcg total) by mouth daily before breakfast. TAKE 1 TABLET BY MOUTH EVERY DAY BEFORE BREAKFAST   No facility-administered medications prior to visit.   Reviewed past medical and social history.   Review of Systems  HENT:  Positive for congestion, rhinorrhea, sinus pain and sneezing. Negative for ear pain and sore throat.   Respiratory:  Positive for cough and wheezing.   Cardiovascular:  Positive for chest pain.  Gastrointestinal:  Negative for abdominal pain, diarrhea, nausea and vomiting.  Genitourinary:  Negative for dysuria.  Musculoskeletal:  Positive for joint pain. Negative for neck pain.  Skin:  Negative for rash.  Neurological:  Positive for headaches.   Per HPI     Objective:    Physical  Exam Vitals and nursing note reviewed.  Constitutional:      General: She is not in acute distress. HENT:     Nose: Congestion present. No nasal tenderness, mucosal edema or rhinorrhea.     Right Nostril: No occlusion.     Left Nostril: No occlusion.     Right Turbinates: Not enlarged, swollen or pale.     Left Turbinates: Not enlarged, swollen or pale.     Right Sinus: No maxillary sinus tenderness or frontal sinus tenderness.     Left Sinus: No maxillary sinus tenderness or frontal sinus tenderness.     Mouth/Throat:     Pharynx: Uvula midline.     Tonsils: No tonsillar exudate or tonsillar abscesses.  Eyes:     Extraocular Movements: Extraocular movements intact.     Conjunctiva/sclera: Conjunctivae normal.  Cardiovascular:     Rate and Rhythm: Normal rate and regular rhythm.     Pulses: Normal pulses.     Heart sounds: Normal heart sounds.  Pulmonary:     Effort: Pulmonary effort is normal.     Breath sounds: Normal breath sounds.  Musculoskeletal:     Cervical back: Normal range of motion and neck supple.  Lymphadenopathy:     Cervical: No cervical adenopathy.  Neurological:     Mental Status: She is alert and oriented to person, place, and time.    BP 138/88   Pulse 84   Temp 98.4 F (36.9 C) (Temporal)   Wt 210 lb 6.4 oz (95.4 kg)   SpO2 97%   BMI 35.01  kg/m    Results for orders placed or performed in visit on 08/04/23  Basic metabolic panel  Result Value Ref Range   Sodium 135 135 - 145 mEq/L   Potassium 4.4 3.5 - 5.1 mEq/L   Chloride 101 96 - 112 mEq/L   CO2 28 19 - 32 mEq/L   Glucose, Bld 100 (H) 70 - 99 mg/dL   BUN 10 6 - 23 mg/dL   Creatinine, Ser 1.61 0.40 - 1.20 mg/dL   GFR 096.04 >54.09 mL/min   Calcium 9.5 8.4 - 10.5 mg/dL  POCT Influenza A/B  Result Value Ref Range   Influenza A, POC Negative Negative   Influenza B, POC Negative Negative      Latest Ref Rng & Units 08/04/2023   10:55 AM 01/27/2022    1:42 PM 05/03/2019    9:24 AM  BMP   Glucose 70 - 99 mg/dL 811  87  95   BUN 6 - 23 mg/dL 10  10  11    Creatinine 0.40 - 1.20 mg/dL 9.14  7.82  9.56   Sodium 135 - 145 mEq/L 135  137  137   Potassium 3.5 - 5.1 mEq/L 4.4  3.8  4.0   Chloride 96 - 112 mEq/L 101  103  105   CO2 19 - 32 mEq/L 28  25  27    Calcium 8.4 - 10.5 mg/dL 9.5  9.1  8.8        Assessment & Plan:   Problem List Items Addressed This Visit   None Visit Diagnoses       COVID-19    -  Primary   Relevant Medications   promethazine-dextromethorphan (PROMETHAZINE-DM) 6.25-15 MG/5ML syrup   nirmatrelvir/ritonavir (PAXLOVID) 20 x 150 MG & 10 x 100MG  TABS   Other Relevant Orders   Basic metabolic panel (Completed)   POCT Influenza A/B (Completed)     Mild intermittent asthma with acute exacerbation       Relevant Medications   albuterol (VENTOLIN HFA) 108 (90 Base) MCG/ACT inhaler   predniSONE (DELTASONE) 20 MG tablet   promethazine-dextromethorphan (PROMETHAZINE-DM) 6.25-15 MG/5ML syrup      Meds ordered this encounter  Medications   albuterol (VENTOLIN HFA) 108 (90 Base) MCG/ACT inhaler    Sig: Inhale 2 puffs into the lungs every 6 (six) hours as needed.    Dispense:  8 g    Refill:  0    Supervising Provider:   Nadene Rubins ALFRED [[id number]]   predniSONE (DELTASONE) 20 MG tablet    Sig: Take 2 tablets (40 mg total) by mouth daily with breakfast for 1 day, THEN 1.5 tablets (30 mg total) daily with breakfast for 1 day, THEN 1 tablet (20 mg total) daily with breakfast for 1 day, THEN 0.5 tablets (10 mg total) daily with breakfast for 1 day.    Dispense:  5 tablet    Refill:  0    Supervising Provider:   Nadene Rubins ALFRED [[id number]]   promethazine-dextromethorphan (PROMETHAZINE-DM) 6.25-15 MG/5ML syrup    Sig: Take 5 mLs by mouth 4 (four) times daily as needed for cough.    Dispense:  180 mL    Refill:  0    Supervising Provider:   Nadene Rubins ALFRED [[id number]]   nirmatrelvir/ritonavir (PAXLOVID) 20 x 150 MG & 10 x 100MG  TABS    Sig: Take 3  tablets by mouth 2 (two) times daily for 5 days. (Take nirmatrelvir 150 mg two tablets twice daily for 5 days and ritonavir  100 mg one tablet twice daily for 5 days) Patient GFR is 102.53    Dispense:  30 tablet    Refill:  0    Supervising Provider:   Nadene Rubins ALFRED [[id number]]   Encourage adequate oral hydration and rest. Use over-the-counter  cold" medicine  such as Dayquil/Nyquil/Theraflu/Alkaseltzer for cough and sinus congestion. Use mucinex DM or Robitussin  or delsym for cough without sinus congestion  You can use plain "Tylenol" or "Advil" for fever, chills and achyness. Use cool mist humidifier at bedtime to help with nasal congestion and cough. Return to office if no improvement in 1week Go to ED if Shortness of breath or fever gets worse.  Return in about 4 weeks (around 09/01/2023) for CPE (fasting).  Alysia Penna, NP

## 2023-08-13 ENCOUNTER — Encounter: Payer: BLUE CROSS/BLUE SHIELD | Admitting: Nurse Practitioner

## 2023-09-14 ENCOUNTER — Encounter: Payer: Self-pay | Admitting: Nurse Practitioner

## 2023-09-14 ENCOUNTER — Ambulatory Visit (INDEPENDENT_AMBULATORY_CARE_PROVIDER_SITE_OTHER): Payer: BC Managed Care – PPO | Admitting: Nurse Practitioner

## 2023-09-14 VITALS — BP 134/82 | HR 78 | Temp 98.0°F | Ht 65.0 in | Wt 211.8 lb

## 2023-09-14 DIAGNOSIS — Z0001 Encounter for general adult medical examination with abnormal findings: Secondary | ICD-10-CM

## 2023-09-14 DIAGNOSIS — D500 Iron deficiency anemia secondary to blood loss (chronic): Secondary | ICD-10-CM | POA: Diagnosis not present

## 2023-09-14 DIAGNOSIS — E559 Vitamin D deficiency, unspecified: Secondary | ICD-10-CM | POA: Diagnosis not present

## 2023-09-14 DIAGNOSIS — Z0000 Encounter for general adult medical examination without abnormal findings: Secondary | ICD-10-CM | POA: Diagnosis not present

## 2023-09-14 DIAGNOSIS — E782 Mixed hyperlipidemia: Secondary | ICD-10-CM | POA: Diagnosis not present

## 2023-09-14 LAB — COMPREHENSIVE METABOLIC PANEL
ALT: 13 U/L (ref 0–35)
AST: 17 U/L (ref 0–37)
Albumin: 4.4 g/dL (ref 3.5–5.2)
Alkaline Phosphatase: 51 U/L (ref 39–117)
BUN: 13 mg/dL (ref 6–23)
CO2: 24 meq/L (ref 19–32)
Calcium: 9.6 mg/dL (ref 8.4–10.5)
Chloride: 103 meq/L (ref 96–112)
Creatinine, Ser: 0.88 mg/dL (ref 0.40–1.20)
GFR: 77.89 mL/min (ref >60.00)
Glucose, Bld: 79 mg/dL (ref 70–99)
Potassium: 4.3 meq/L (ref 3.5–5.1)
Sodium: 138 meq/L (ref 135–145)
Total Bilirubin: 0.4 mg/dL (ref 0.2–1.2)
Total Protein: 7.7 g/dL (ref 6.0–8.3)

## 2023-09-14 LAB — LIPID PANEL
Cholesterol: 297 mg/dL — ABNORMAL HIGH (ref 0–200)
HDL: 69.8 mg/dL (ref >39.00)
LDL Cholesterol: 206 mg/dL — ABNORMAL HIGH (ref 0–99)
NonHDL: 226.85
Total CHOL/HDL Ratio: 4
Triglycerides: 103 mg/dL (ref 0.0–149.0)
VLDL: 20.6 mg/dL (ref 0.0–40.0)

## 2023-09-14 LAB — CBC
HCT: 37.1 % (ref 36.0–46.0)
Hemoglobin: 12.3 g/dL (ref 12.0–15.0)
MCHC: 33.1 g/dL (ref 30.0–36.0)
MCV: 85.5 fl (ref 78.0–100.0)
Platelets: 312 10*3/uL (ref 150.0–400.0)
RBC: 4.33 Mil/uL (ref 3.87–5.11)
RDW: 13.6 % (ref 11.5–15.5)
WBC: 6.6 10*3/uL (ref 4.0–10.5)

## 2023-09-14 LAB — VITAMIN D 25 HYDROXY (VIT D DEFICIENCY, FRACTURES): VITD: 25.58 ng/mL — ABNORMAL LOW (ref 30.00–100.00)

## 2023-09-14 NOTE — Patient Instructions (Signed)
 Go to lab Maintain Heart healthy diet and daily exercise.  Mediterranean Diet A Mediterranean diet is based on the traditions of countries on the [employer]. It focuses on eating more: Fruits and vegetables. Whole grains, beans, nuts, and seeds. Heart-healthy fats. These are fats that are good for your heart. It involves eating less: Dairy. Meat and eggs. Processed foods with added sugar, salt, and fat. This type of diet can help prevent certain conditions. It can also improve outcomes if you have a long-term (chronic) disease, such as kidney or heart disease. What are tips for following this plan? Reading food labels Check packaged foods for: The serving size. For foods such as rice and pasta, the serving size is the amount of cooked product, not dry. The total fat. Avoid foods with saturated fat or trans fat. Added sugars, such as corn syrup. Shopping  Try to have a balanced diet. Buy a variety of foods, such as: Fresh fruits and vegetables. You may be able to get these from local farmers markets. You can also buy them frozen. Grains, beans, nuts, and seeds. Some of these can be bought in bulk. Fresh seafood. Poultry and eggs. Low-fat dairy products. Buy whole ingredients instead of foods that have already been packaged. If you can't get fresh seafood, buy precooked frozen shrimp or canned fish, such as tuna, salmon, or sardines. Stock your pantry so you always have certain foods on hand, such as olive oil, canned tuna, canned tomatoes, rice, pasta, and beans. Cooking Cook foods with extra-virgin olive oil instead of using butter or other vegetable oils. Have meat as a side dish. Have vegetables or grains as your main dish. This means having meat in small portions or adding small amounts of meat to foods like pasta or stew. Use beans or vegetables instead of meat in common dishes like chili or lasagna. Try out different cooking methods. Try roasting, broiling, steaming, and  sauting vegetables. Add frozen vegetables to soups, stews, pasta, or rice. Add nuts or seeds for added healthy fats and plant protein at each meal. You can add these to yogurt, salads, or vegetable dishes. Marinate fish or vegetables using olive oil, lemon juice, garlic, and fresh herbs. Meal planning Plan to eat a vegetarian meal one day each week. Try to work up to two vegetarian meals, if possible. Eat seafood two or more times a week. Have healthy snacks on hand. These may include: Vegetable sticks with hummus. Greek yogurt. Fruit and nut trail mix. Eat balanced meals. These should include: Fruit: 2-3 servings a day. Vegetables: 4-5 servings a day. Low-fat dairy: 2 servings a day. Fish, poultry, or lean meat: 1 serving a day. Beans and legumes: 2 or more servings a week. Nuts and seeds: 1-2 servings a day. Whole grains: 6-8 servings a day. Extra-virgin olive oil: 3-4 servings a day. Limit red meat and sweets to just a few servings a month. Lifestyle  Try to cook and eat meals with your family. Drink enough fluid to keep your pee (urine) pale yellow. Be active every day. This includes: Aerobic exercise, which is exercise that causes your heart to beat faster. Examples include running and swimming. Leisure activities like gardening, walking, or housework. Get 7-8 hours of sleep each night. Drink red wine if your provider says you can. A glass of wine is 5 oz (150 mL). You may be allowed to have: Up to 1 glass a day if you're female and not pregnant. Up to 2 glasses a day if  you're female. What foods should I eat? Fruits Apples. Apricots. Avocado. Berries. Bananas. Cherries. Dates. Figs. Grapes. Lemons. Melon. Oranges. Peaches. Plums. Pomegranate. Vegetables Artichokes. Beets. Broccoli. Cabbage. Carrots. Eggplant. Green beans. Chard. Kale. Spinach. Onions. Leeks. Peas. Squash. Tomatoes. Peppers. Radishes. Grains Whole-grain pasta. Brown rice. Bulgur wheat. Polenta. Couscous.  Whole-wheat bread. Orpah Cobb. Meats and other proteins Beans. Almonds. Sunflower seeds. Pine nuts. Peanuts. Cod. Salmon. Scallops. Shrimp. Tuna. Tilapia. Clams. Oysters. Eggs. Chicken or [place] without skin. Dairy Low-fat milk. Cheese. Greek yogurt. Fats and oils Extra-virgin olive oil. Avocado oil. Grapeseed oil. Beverages Water. Red wine. Herbal tea. Sweets and desserts Greek yogurt with honey. Baked apples. Poached pears. Trail mix. Seasonings and condiments Basil. Cilantro. Coriander. Cumin. Mint. Parsley. Sage. Rosemary. Tarragon. Garlic. Oregano. Thyme. Pepper. Balsamic vinegar. Tahini. Hummus. Tomato sauce. Olives. Mushrooms. The items listed above may not be all the foods and drinks you can have. Talk to a dietitian to learn more. What foods should I limit? This is a list of foods that should be eaten rarely. Fruits Fruit canned in syrup. Vegetables Deep-fried potatoes, like [place] fries. Grains Packaged pasta or rice dishes. Cereal with added sugar. Snacks with added sugar. Meats and other proteins Beef. Pork. Lamb. Chicken or [place] with skin. Hot dogs. Tomasa Blase. Dairy Ice cream. Sour cream. Whole milk. Fats and oils Butter. Canola oil. Vegetable oil. Beef fat (tallow). Lard. Beverages Juice. Sugar-sweetened soft drinks. Beer. Liquor and spirits. Sweets and desserts Cookies. Cakes. Pies. Candy. Seasonings and condiments Mayonnaise. Pre-made sauces and marinades. The items listed above may not be all the foods and drinks you should limit. Talk to a dietitian to learn more. Where to find more information American Heart Association (AHA): heart.org This information is not intended to replace advice given to you by your health care provider. Make sure you discuss any questions you have with your health care provider. Document Revised: 09/20/2022 Document Reviewed: 09/20/2022 Elsevier Patient Education  2024 [employer]  How to Increase Your Level of Physical  Activity Getting regular physical activity is important for your overall health and well-being. Most people do not get enough exercise. There are easy ways to increase your level of physical activity, even if you have not been very active in the past or if you are just starting out. What are the benefits of physical activity? Physical activity has many short-term and long-term benefits. Being active on a regular basis can improve your physical and mental health as well as provide other benefits. Physical health benefits Helping you lose weight or maintain a healthy weight. Strengthening your muscles and bones. Reducing your risk of certain long-term (chronic) diseases, including heart disease, cancer, and diabetes. Being able to move around more easily and for longer periods of time without getting tired (increased endurance or stamina). Improving your ability to fight off illness (enhanced immunity). Being able to sleep better. Helping you stay healthy as you get older, including: Helping you stay mobile, or capable of walking and moving around. Preventing accidents, such as falls. Increasing life expectancy. Mental health benefits Boosting your mood and improving your self-esteem. Lowering your chance of having mental health problems, such as depression or anxiety. Helping you feel good about your body. Other benefits Finding new sources of fun and enjoyment. Meeting new people who share a common interest. Before you begin If you have a chronic illness or have not been active for a while, check with your health care provider about how to get started. Ask your health care provider  what activities are safe for you. Start out slowly. Walking or doing some simple chair exercises is a good place to start, especially if you have not been active before or for a long time. Set goals that you can work toward. Ask your health care provider how much exercise is best for you. In general, most adults  should: Do moderate-intensity exercise for at least 150 minutes each week (30 minutes on most days of the week) or vigorous exercise for at least 75 minutes each week, or a combination of these. Moderate-intensity exercise can include walking at a quick pace, biking, yoga, water aerobics, or gardening. Vigorous exercise involves activities that take more effort, such as jogging or running, playing sports, swimming laps, or jumping rope. Do strength exercises on at least 2 days each week. This can include weight lifting, body weight exercises, and resistance-band exercises. How to be more physically active Make a plan  Try to find activities that you enjoy. You are more likely to commit to an exercise routine if it does not feel like a chore. If you have bone or joint problems, choose low-impact exercises, like walking or swimming. Use these tips for being successful with an exercise plan: Find a workout partner for accountability. Join a group or class, such as an aerobics class, cycling class, or sports team. Make family time active. Go for a walk, bike, or swim. Include a variety of exercises each week. Consider using a fitness tracker, such as a mobile phone app or a device worn like a watch, that will count the number of steps you take each day. Many people strive to reach 10,000 steps a day. Find ways to be active in your daily routines Besides your formal exercise plans, you can find ways to do physical activity during your daily routines, such as: Walking or biking to work or to the store. Taking the stairs instead of the elevator. Parking farther away from the door at work or at the store. Planning walking meetings. Walking around while you are on the phone. Where to find more information Centers for Disease Control and Prevention: [employer].pt President's Council on Fitness, Sports & Nutrition: www.fitness.gov ChooseMyPlate: http://www.harvey.com/ Contact a health care  provider if: You have headaches, muscle aches, or joint pain that is concerning. You feel dizzy or light-headed while exercising. You faint. You feel your heart skipping, racing, or fluttering. You have chest pain while exercising. Summary Exercise benefits your mind and body at any age, even if you are just starting out. If you have a chronic illness or have not been active for a while, check with your health care provider before increasing your physical activity. Choose activities that are safe and enjoyable for you. Ask your health care provider what activities are safe for you. Start slowly. Tell your health care provider if you have problems as you start to increase your activity level. This information is not intended to replace advice given to you by your health care provider. Make sure you discuss any questions you have with your health care provider. Document Revised: 10/04/2020 Document Reviewed: 10/04/2020 Elsevier Patient Education  2024 [employer].

## 2023-09-14 NOTE — Progress Notes (Signed)
 Complete physical exam  Patient: Jordan [name]   DOB: 1975/10/18   48 y.o. Female  MRN: [id number] Visit Date: 09/14/2023  Subjective:    Chief Complaint  Patient presents with   Annual Exam    Concerned about fatigue and nasal congestion from last COVID Dx    Jordan [name] is a 48 y.o. female who presents today for a complete physical exam. She reports consuming a general diet.  No exercise regimen  She generally feels well. She reports sleeping well. She does have additional problems to discuss today.  Vision:Yes Dental:Yes STD Screen:No UTD with PAP per dr. Gretta Arab requested.  BP Readings from Last 3 Encounters:  09/14/23 134/82  08/04/23 138/88  01/04/23 124/70   Wt Readings from Last 3 Encounters:  09/14/23 211 lb 12.8 oz (96.1 kg)  08/04/23 210 lb 6.4 oz (95.4 kg)  01/04/23 208 lb (94.3 kg)    Most recent fall risk assessment:    09/14/2023    1:45 PM  Fall Risk   Falls in the past year? 0  Number falls in past yr: 0  Injury with Fall? 0  Risk for fall due to : No Fall Risks  Follow up Falls evaluation completed   Depression screen:Yes - No Depression Most recent depression screenings:    09/14/2023    1:45 PM 08/04/2023   11:45 AM  PHQ 2/9 Scores  PHQ - 2 Score 0 0  PHQ- 9 Score 2 2   HPI  No problem-specific Assessment & Plan notes found for this encounter.  Past Medical History:  Diagnosis Date   Allergy    Anemia    Complication of anesthesia 06/23/2007   failed epidural, severe pain from second attempt   Heart murmur    HSV-2 infection    Hyperlipidemia    no meds   Hypertension    no meds   Hypothyroid    graves   Past Surgical History:  Procedure Laterality Date   CESAREAN SECTION     COLONOSCOPY  03/10/2022   DILATION AND CURETTAGE OF UTERUS  06/22/1993   TAB   HYSTEROSCOPY  03/21/2012   Procedure: HYSTEROSCOPY;  Surgeon: Serita Kyle, MD;  Location: [facility];  Service: Gynecology;;   IUD REMOVAL  03/21/2012    Procedure: INTRAUTERINE DEVICE (IUD) REMOVAL;  Surgeon: Serita Kyle, MD;  Location: [facility];  Service: Gynecology;  Laterality: N/A;   LAPAROSCOPIC TUBAL LIGATION  03/21/2012   Procedure: LAPAROSCOPIC TUBAL LIGATION;  Surgeon: Serita Kyle, MD;  Location: [facility];  Service: Gynecology;  Laterality: Bilateral;  with cautery   SUPERFICIAL LYMPH NODE BIOPSY / EXCISION     TUBAL LIGATION     UTERINE SUSPENSION  06/22/2005   Social History   Socioeconomic History   Marital status: Married    Spouse name: Not on file   Number of children: 2   Years of education: Not on file   Highest education level: Some college, no degree  Occupational History   Not on file  Tobacco Use   Smoking status: Never   Smokeless tobacco: Never  Vaping Use   Vaping status: Never Used  Substance and Sexual Activity   Alcohol use: Yes    Comment: once every 2 mths   Drug use: No   Sexual activity: Yes    Partners: Male    Birth control/protection: Surgical    Comment: tubal ligation  Other Topics Concern   Not on file  Social History  Narrative   Not on file   Social Drivers of Health   Financial Resource Strain: Low Risk  (09/13/2023)   Overall Financial Resource Strain (CARDIA)    Difficulty of Paying Living Expenses: Not hard at all  Food Insecurity: No Food Insecurity (09/13/2023)   Hunger Vital Sign    Worried About Running Out of Food in the Last Year: Never true    Ran Out of Food in the Last Year: Never true  Transportation Needs: No Transportation Needs (09/13/2023)   PRAPARE - Administrator, Civil Service (Medical): No    Lack of Transportation (Non-Medical): No  Physical Activity: Insufficiently Active (09/13/2023)   Exercise Vital Sign    Days of Exercise per Week: 1 day    Minutes of Exercise per Session: 20 min  Stress: No Stress Concern Present (09/13/2023)   [employer] of Occupational Health - Occupational Stress Questionnaire    Feeling of Stress  : Not at all  Social Connections: Moderately Integrated (09/13/2023)   Social Connection and Isolation Panel [NHANES]    Frequency of Communication with Friends and Family: More than three times a week    Frequency of Social Gatherings with Friends and Family: Once a week    Attends Religious Services: More than 4 times per year    Active Member of [employer] or Organizations: No    Attends Engineer, structural: Not on file    Marital Status: Married  Catering manager Violence: Not on file   Family Status  Relation Name Status   Mother  Alive   Father  Alive   Brother  (Not Specified)   Mat Aunt  (Not Specified)   Emelda Brothers  (Not Specified)   Oceanographer  (Not Specified)   MGM  (Not Specified)   PGM  (Not Specified)   Neg Hx  (Not Specified)  No partnership data on file   Family History  Problem Relation Age of Onset   Hypertension Mother    Allergies Mother    Rheum arthritis Mother    Hypertension Father    Allergies Father    Thyroid disease Brother    Breast cancer Maternal Aunt 49   Breast cancer Paternal Aunt 21   Thyroid disease Paternal Aunt    Thyroid disease Paternal Aunt    Stroke Maternal Grandmother    Stroke Paternal Grandmother    Thyroid disease Paternal Grandmother    Colon cancer Neg Hx    Esophageal cancer Neg Hx    Stomach cancer Neg Hx    Rectal cancer Neg Hx    No Known Allergies  Patient Care Team: Zackery Brine, Bonna Gains, NP as PCP - General (Internal Medicine) Maxie Better, MD as Consulting Physician (Obstetrics and Gynecology)   Medications: Outpatient Medications Prior to Visit  Medication Sig   fexofenadine-pseudoephedrine (ALLEGRA-D 24) 180-240 MG 24 hr tablet Take 1 tablet by mouth daily.   Multiple Vitamins-Minerals (MULTIVITAMIN PO) Take by mouth.   progesterone (PROMETRIUM) 100 MG capsule Take 100 mg by mouth daily.   SYNTHROID 150 MCG tablet Take 1 tablet (150 mcg total) by mouth daily before breakfast. TAKE 1 TABLET BY MOUTH  EVERY DAY BEFORE BREAKFAST   albuterol (VENTOLIN HFA) 108 (90 Base) MCG/ACT inhaler Inhale 2 puffs into the lungs every 6 (six) hours as needed. (Patient not taking: Reported on 09/14/2023)   promethazine-dextromethorphan (PROMETHAZINE-DM) 6.25-15 MG/5ML syrup Take 5 mLs by mouth 4 (four) times daily as needed for cough. (Patient not taking:  Reported on 09/14/2023)   [DISCONTINUED] cetirizine (ZYRTEC) 10 MG tablet Take 10 mg by mouth daily. Takes PRN (Patient not taking: Reported on 09/14/2023)   No facility-administered medications prior to visit.    Review of Systems  Constitutional:  Negative for activity change, appetite change and unexpected weight change.  Respiratory: Negative.    Cardiovascular: Negative.   Gastrointestinal: Negative.   Endocrine: Negative for cold intolerance and heat intolerance.  Genitourinary: Negative.   Musculoskeletal: Negative.   Skin: Negative.   Neurological: Negative.   Hematological: Negative.   Psychiatric/Behavioral:  Negative for behavioral problems, decreased concentration, dysphoric mood, hallucinations, self-injury, sleep disturbance and suicidal ideas. The patient is not nervous/anxious.         Objective:  BP 134/82 (BP Location: Left Arm, Patient Position: Sitting, Cuff Size: Large)   Pulse 78   Temp 98 F (36.7 C) (Temporal)   Ht 5\' 5"  (1.651 m)   Wt 211 lb 12.8 oz (96.1 kg)   LMP 07/31/2023   SpO2 99%   BMI 35.25 kg/m     Physical Exam Vitals and nursing note reviewed.  Constitutional:      General: She is not in acute distress. HENT:     Right Ear: Tympanic membrane, ear canal and external ear normal.     Left Ear: Tympanic membrane, ear canal and external ear normal.     Nose: Nose normal.  Eyes:     Extraocular Movements: Extraocular movements intact.     Conjunctiva/sclera: Conjunctivae normal.     Pupils: Pupils are equal, round, and reactive to light.  Neck:     Thyroid: No thyroid mass, thyromegaly or thyroid  tenderness.  Cardiovascular:     Rate and Rhythm: Normal rate and regular rhythm.     Pulses: Normal pulses.     Heart sounds: Normal heart sounds.  Pulmonary:     Effort: Pulmonary effort is normal.     Breath sounds: Normal breath sounds.  Abdominal:     General: Bowel sounds are normal.     Palpations: Abdomen is soft.  Musculoskeletal:        General: Normal range of motion.     Cervical back: Normal range of motion and neck supple.     Right lower leg: No edema.     Left lower leg: No edema.  Lymphadenopathy:     Cervical: No cervical adenopathy.  Skin:    General: Skin is warm and dry.  Neurological:     Mental Status: She is alert and oriented to person, place, and time.     Cranial Nerves: No cranial nerve deficit.  Psychiatric:        Mood and Affect: Mood normal.        Behavior: Behavior normal.        Thought Content: Thought content normal.      No results found for any visits on 09/14/23.    Assessment & Plan:    Routine Health Maintenance and Physical Exam  Immunization History  Administered Date(s) Administered   Influenza Whole 03/22/2017   Influenza,inj,Quad PF,6+ Mos 04/08/2016, 04/12/2017, 03/22/2018, 07/03/2021   Influenza-Unspecified 02/21/2019   PFIZER(Purple Top)SARS-COV-2 Vaccination 09/04/2019, 09/25/2019, 05/11/2020   Pfizer(Comirnaty)Fall Seasonal Vaccine 12 years and older 04/09/2022, 02/24/2023   Tdap 07/06/2007, 01/27/2022   Health Maintenance  Topic Date Due   Pneumococcal Vaccine 39-48 Years old (1 of 2 - PCV) Never done   HIV Screening  Never done   Hepatitis C Screening  Never done  Cervical Cancer Screening (HPV/Pap Cotest)  01/21/2023   INFLUENZA VACCINE  09/20/2023 (Originally 01/21/2023)   DTaP/Tdap/Td (3 - Td or Tdap) 01/28/2032   Colonoscopy  03/10/2032   COVID-19 Vaccine  Completed   HPV VACCINES  Aged Out   Discussed health benefits of physical activity, and encouraged her to engage in regular exercise appropriate for  her age and condition.  Problem List Items Addressed This Visit     Hyperlipidemia   Relevant Orders   Lipid panel   Iron deficiency anemia due to chronic blood loss   Relevant Orders   CBC   Iron, TIBC and Ferritin Panel   Vitamin D deficiency   Relevant Orders   VITAMIN D 25 Hydroxy (Vit-D Deficiency, Fractures)   Other Visit Diagnoses       Encounter for preventative adult health care exam with abnormal findings    -  Primary   Relevant Orders   Comprehensive metabolic panel      Return in about 4 weeks (around 10/12/2023) for Weight management and fatigue.     Alysia Penna, NP

## 2023-09-15 LAB — IRON,TIBC AND FERRITIN PANEL
%SAT: 19 % (ref 16–45)
Ferritin: 8 ng/mL — ABNORMAL LOW (ref 16–232)
Iron: 80 ug/dL (ref 40–190)
TIBC: 421 ug/dL (ref 250–450)

## 2023-09-16 ENCOUNTER — Other Ambulatory Visit: Payer: Self-pay | Admitting: Nurse Practitioner

## 2023-09-16 ENCOUNTER — Encounter: Payer: Self-pay | Admitting: Nurse Practitioner

## 2023-09-16 MED ORDER — ATORVASTATIN CALCIUM 40 MG PO TABS: 40.0000 mg | ORAL_TABLET | Freq: Every evening | ORAL | 1 refills | Status: DC

## 2023-09-16 NOTE — Assessment & Plan Note (Signed)
 Persistently abnormal lipid panel: with LDL >190. Start atorvastatin 40mg  in PM. It is also important to maintain a mediterranean diet, daily exercise, avoid tobacco and alcohol use; to prevent development of heart disease and fatty liver.  Schedule lab appointment to repeat lipid panel in 3months (fasting)

## 2023-09-16 NOTE — Addendum Note (Signed)
 Addended by: Michaela Corner on: 09/16/2023 10:48 AM   Modules accepted: Orders

## 2023-10-13 ENCOUNTER — Ambulatory Visit: Admitting: Nurse Practitioner

## 2023-11-02 IMAGING — MG MM DIGITAL SCREENING BILAT W/ TOMO AND CAD
8 series · 8 of 24 positions shown · non-contrast
Comparison: Previous exam(s).

CLINICAL DATA: Screening.

EXAM:
DIGITAL SCREENING BILATERAL MAMMOGRAM WITH TOMOSYNTHESIS AND CAD
TECHNIQUE: Bilateral screening digital craniocaudal and mediolateral oblique
mammograms were obtained. Bilateral screening digital breast
tomosynthesis was performed. The images were evaluated with
computer-aided detection.

[L CC synth-2D]
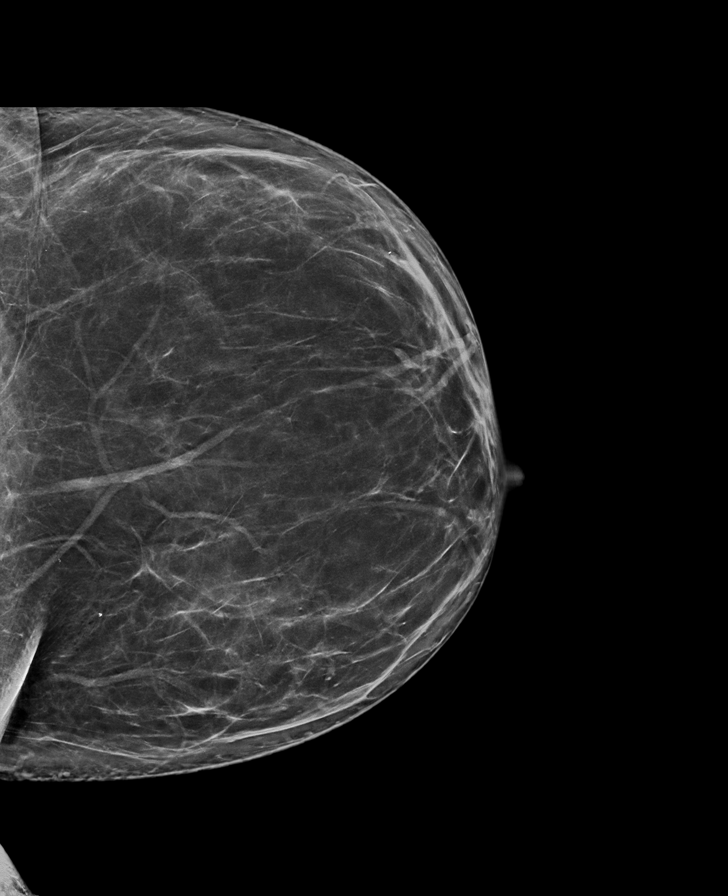

[L MLO synth-2D]
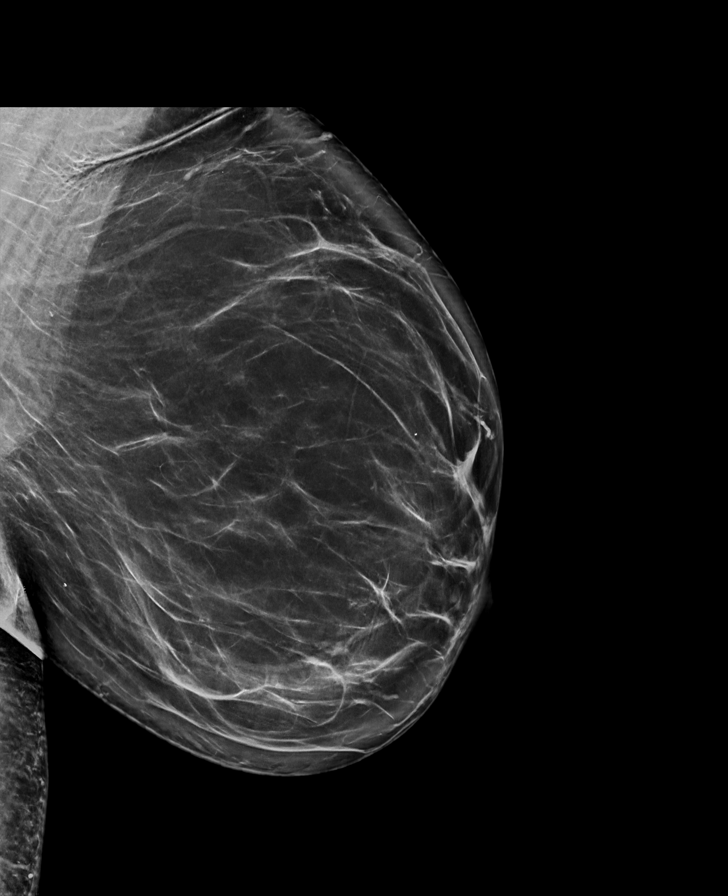

[R CC synth-2D]
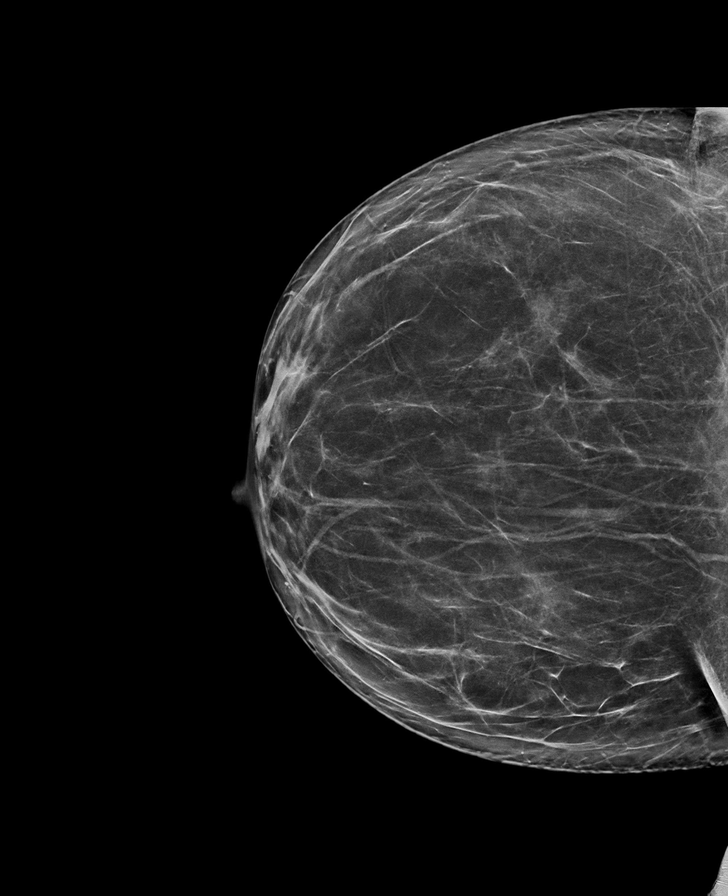

[R MLO synth-2D]
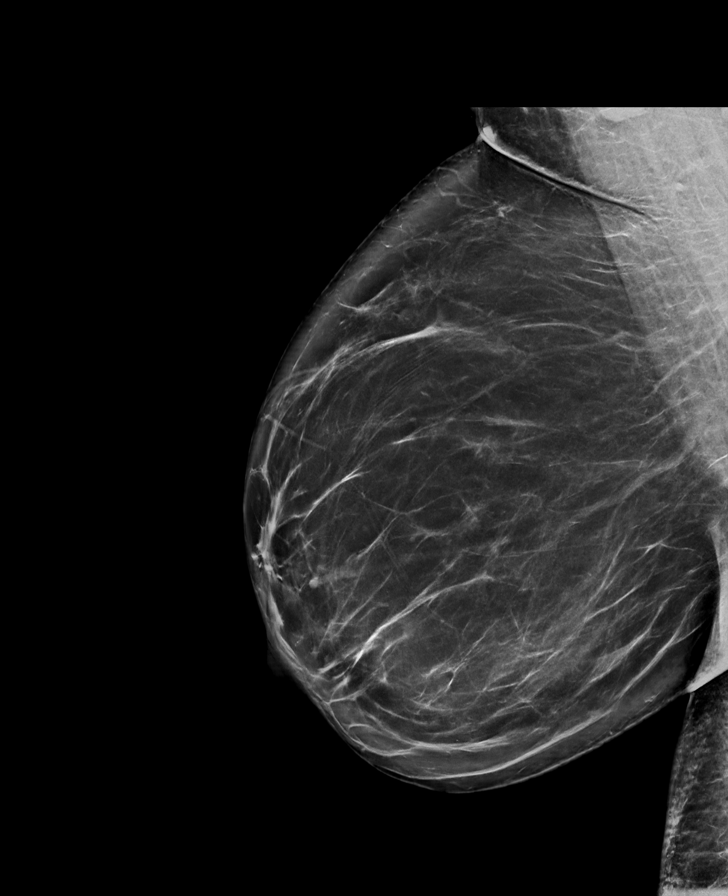

[L MLO tomo · tomo slice 50/99.0]
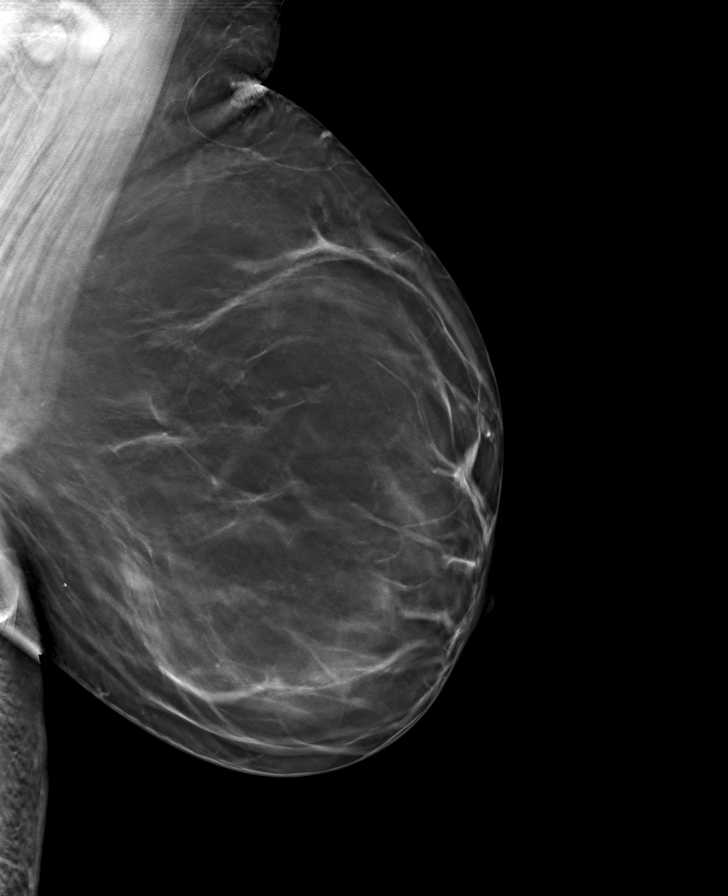

[L CC tomo · tomo slice 45/88.0]
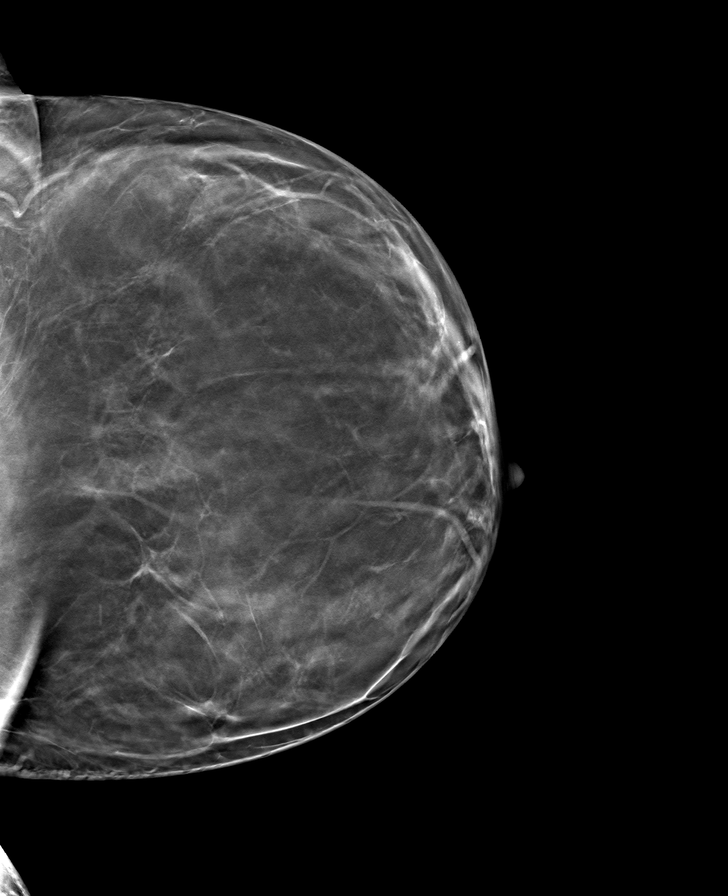

[R MLO tomo · tomo slice 51/101.0]
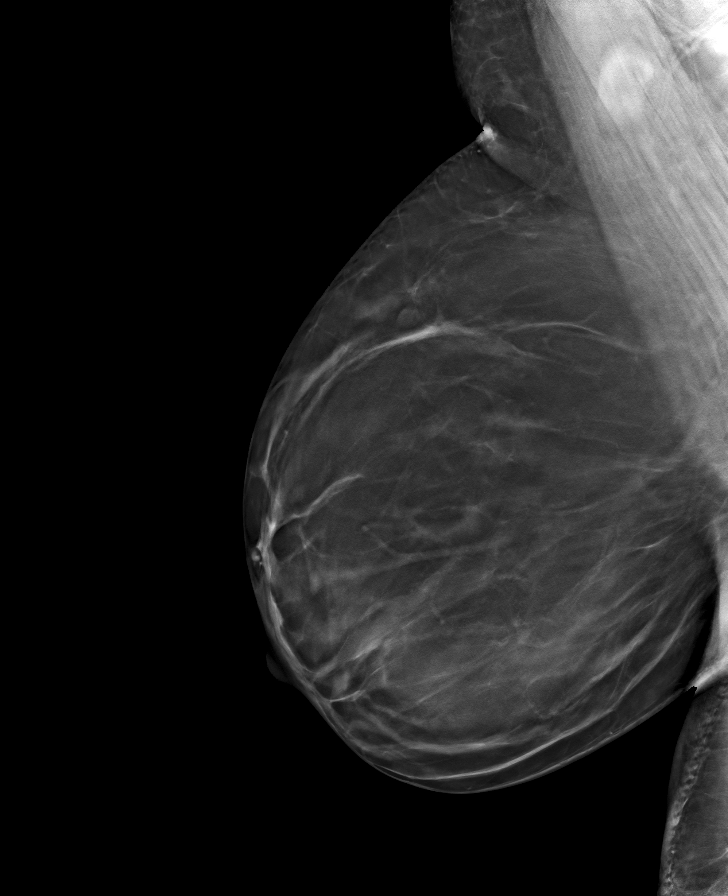

[R CC tomo · tomo slice 43/85.0]
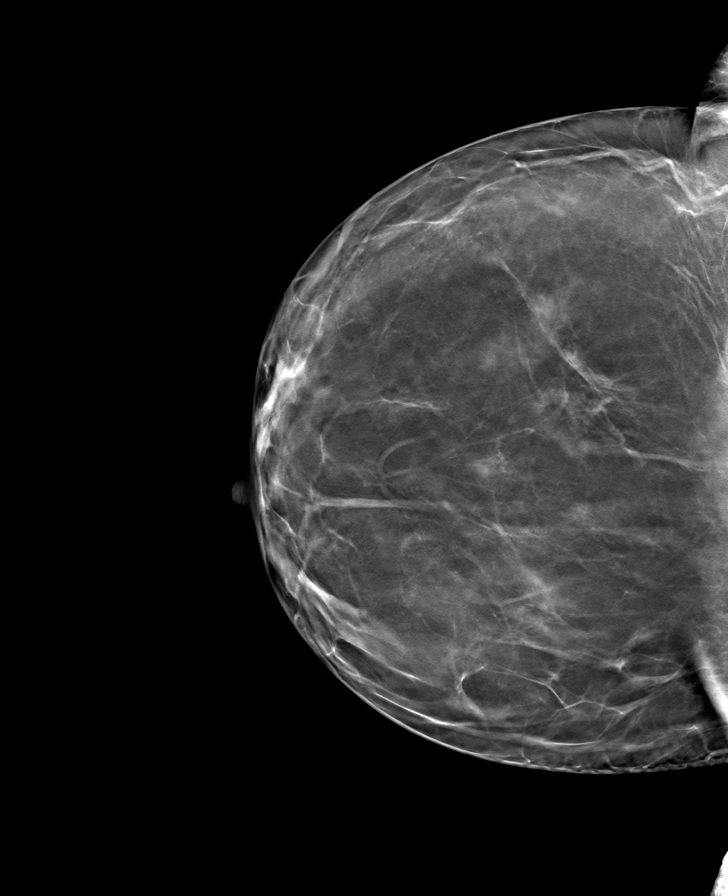

[8 of 24 positions shown; findings below may reference images not displayed]

ACR Breast Density Category b: There are scattered areas of
fibroglandular density.
FINDINGS: There are no findings suspicious for malignancy.
IMPRESSION: No mammographic evidence of malignancy. A result letter of this
screening mammogram will be mailed directly to the patient.

RECOMMENDATION:
Screening mammogram in one year. (Code:51-O-LD2)

BI-RADS CATEGORY  1: Negative.

## 2024-01-04 ENCOUNTER — Ambulatory Visit: Payer: BLUE CROSS/BLUE SHIELD | Admitting: Internal Medicine

## 2024-02-18 ENCOUNTER — Ambulatory Visit: Admitting: Internal Medicine

## 2024-04-01 ENCOUNTER — Other Ambulatory Visit: Payer: Self-pay | Admitting: Nurse Practitioner

## 2024-04-01 DIAGNOSIS — E782 Mixed hyperlipidemia: Secondary | ICD-10-CM

## 2024-04-04 ENCOUNTER — Ambulatory Visit: Admitting: Internal Medicine

## 2024-04-06 ENCOUNTER — Encounter: Payer: Self-pay | Admitting: Internal Medicine

## 2024-04-06 ENCOUNTER — Other Ambulatory Visit

## 2024-04-06 ENCOUNTER — Ambulatory Visit: Admitting: Internal Medicine

## 2024-04-06 VITALS — BP 120/80 | HR 83 | Ht 65.0 in | Wt 216.8 lb

## 2024-04-06 DIAGNOSIS — E890 Postprocedural hypothyroidism: Secondary | ICD-10-CM

## 2024-04-06 LAB — T4, FREE: Free T4: 1.3 ng/dL (ref 0.8–1.8)

## 2024-04-06 LAB — TSH: TSH: 0.89 m[IU]/L

## 2024-04-06 NOTE — Progress Notes (Addendum)
 Subjective:     Patient ID: Jordan [name], female   DOB: 11/06/1975, 48 y.o.   MRN: [id number]  HPI Jordan [name] is a 48 y.o. woman, returning for followup for hypothyroidism post RAI ablation for Graves' disease in 1997. Last visit 1 year and 3 months ago.  Interim history:  No complaints at today's visit other than weight gain: 8 pounds since our last visit.  She is planning to start exercising. She started a new job after approximately 3 years of being out of work. She works full time. She denies palpitations, tremors, anxiety, or increased fatigue.  Reviewed her TFTs: Lab Results  Component Value Date   TSH 1.88 01/04/2023   TSH 6.98 (H) 05/22/2022   TSH 0.19 (L) 11/11/2021   TSH 2.88 12/13/2020   TSH 1.21 03/23/2019   TSH 0.86 03/22/2018   FREET4 0.82 01/04/2023   FREET4 0.84 05/22/2022   FREET4 1.17 11/11/2021   FREET4 0.96 12/13/2020   FREET4 1.08 03/23/2019   FREET4 1.08 03/22/2018   Pt is on Synthroid  150 mcg daily, taken: - in am - fasting - at least 30 min from b'fast - no Ca, Fe, PPIs - + Multivitamins at night - not on Biotin  Pt denies: - feeling nodules in neck - hoarseness - dysphagia - choking  No FH of thyroid  cancer. No h/o radiation tx to head or neck. No herbal supplements. No Biotin use. No recent steroids use.   She used to do [employer] in the past but stopped after developed bronchitis in 2019.  Review of Systems + see HPI  I reviewed pt's medications, allergies, PMH, social hx, family hx, and changes were documented in the history of present illness. Otherwise, unchanged from my initial visit note.  Past Medical History:  Diagnosis Date   Allergy     Anemia    Complication of anesthesia 06/23/2007   failed epidural, severe pain from second attempt   Heart murmur    HSV-2 infection    Hyperlipidemia    no meds   Hypertension    no meds   Hypothyroid    graves   Past Surgical History:  Procedure Laterality Date   CESAREAN  SECTION     COLONOSCOPY  03/10/2022   DILATION AND CURETTAGE OF UTERUS  06/22/1993   TAB   HYSTEROSCOPY  03/21/2012   Procedure: HYSTEROSCOPY;  Surgeon: Dickie DELENA Carder, MD;  Location: [facility];  Service: Gynecology;;   IUD REMOVAL  03/21/2012   Procedure: INTRAUTERINE DEVICE (IUD) REMOVAL;  Surgeon: Dickie DELENA Carder, MD;  Location: [facility];  Service: Gynecology;  Laterality: N/A;   LAPAROSCOPIC TUBAL LIGATION  03/21/2012   Procedure: LAPAROSCOPIC TUBAL LIGATION;  Surgeon: Dickie DELENA Carder, MD;  Location: [facility];  Service: Gynecology;  Laterality: Bilateral;  with cautery   SUPERFICIAL LYMPH NODE BIOPSY / EXCISION     TUBAL LIGATION     UTERINE SUSPENSION  06/22/2005   Social History   Socioeconomic History   Marital status: Married    Spouse name: Not on file   Number of children: 2   Years of education: Not on file   Highest education level: Some college, no degree  Occupational History   Not on file  Tobacco Use   Smoking status: Never   Smokeless tobacco: Never  Vaping Use   Vaping status: Never Used  Substance and Sexual Activity   Alcohol use: Yes    Comment: once every 2 mths   Drug use: No  Sexual activity: Yes    Partners: Male    Birth control/protection: Surgical    Comment: tubal ligation  Other Topics Concern   Not on file  Social History Narrative   Not on file   Social Drivers of Health   Financial Resource Strain: Low Risk  (09/13/2023)   Overall Financial Resource Strain (CARDIA)    Difficulty of Paying Living Expenses: Not hard at all  Food Insecurity: No Food Insecurity (09/13/2023)   Hunger Vital Sign    Worried About Running Out of Food in the Last Year: Never true    Ran Out of Food in the Last Year: Never true  Transportation Needs: No Transportation Needs (09/13/2023)   PRAPARE - Administrator, Civil Service (Medical): No    Lack of Transportation (Non-Medical): No  Physical Activity: Insufficiently Active (09/13/2023)    Exercise Vital Sign    Days of Exercise per Week: 1 day    Minutes of Exercise per Session: 20 min  Stress: No Stress Concern Present (09/13/2023)   [employer] of Occupational Health - Occupational Stress Questionnaire    Feeling of Stress : Not at all  Social Connections: Moderately Integrated (09/13/2023)   Social Connection and Isolation Panel    Frequency of Communication with Friends and Family: More than three times a week    Frequency of Social Gatherings with Friends and Family: Once a week    Attends Religious Services: More than 4 times per year    Active Member of [employer] or Organizations: No    Attends Engineer, structural: Not on file    Marital Status: Married  Catering manager Violence: Not on file   Current Outpatient Medications on File Prior to Visit  Medication Sig Dispense Refill   atorvastatin  (LIPITOR) 40 MG tablet TAKE 1 TABLET BY MOUTH EVERY DAY IN THE EVENING 30 tablet 0   fexofenadine -pseudoephedrine  (ALLEGRA-D 24) 180-240 MG 24 hr tablet Take 1 tablet by mouth daily.     Multiple Vitamins-Minerals (MULTIVITAMIN PO) Take by mouth.     progesterone  (PROMETRIUM ) 100 MG capsule Take 100 mg by mouth daily.     SYNTHROID  150 MCG tablet Take 1 tablet (150 mcg total) by mouth daily before breakfast. TAKE 1 TABLET BY MOUTH EVERY DAY BEFORE BREAKFAST 90 tablet 2   No current facility-administered medications on file prior to visit.   No Known Allergies Family History  Problem Relation Age of Onset   Hypertension Mother    Allergies Mother    Rheum arthritis Mother    Hypertension Father    Allergies Father    Thyroid  disease Brother    Breast cancer Maternal Aunt 70   Breast cancer Paternal Aunt 62   Thyroid  disease Paternal Aunt    Thyroid  disease Paternal Aunt    Stroke Maternal Grandmother    Stroke Paternal Grandmother    Thyroid  disease Paternal Grandmother    Colon cancer Neg Hx    Esophageal cancer Neg Hx    Stomach cancer Neg Hx     Rectal cancer Neg Hx     Objective:   Physical Exam BP 120/80   Pulse 83   Ht 5' 5 (1.651 m)   Wt 216 lb 12.8 oz (98.3 kg)   SpO2 96%   BMI 36.08 kg/m   Wt Readings from Last 10 Encounters:  04/06/24 216 lb 12.8 oz (98.3 kg)  09/14/23 211 lb 12.8 oz (96.1 kg)  08/04/23 210 lb 6.4 oz (95.4 kg)  01/04/23 208 lb (94.3 kg)  03/10/22 208 lb 3.2 oz (94.4 kg)  02/17/22 208 lb 3.2 oz (94.4 kg)  01/27/22 210 lb 3.2 oz (95.3 kg)  11/11/21 210 lb 3.2 oz (95.3 kg)  12/18/20 215 lb (97.5 kg)  11/05/20 216 lb (98 kg)   Constitutional: overweight, in NAD Eyes:  EOMI, no exophthalmos ENT: no neck masses, no cervical lymphadenopathy Cardiovascular: RRR, No MRG Respiratory: CTA B Musculoskeletal: no deformities Skin:no rashes Neurological: no tremor with outstretched hands  Assessment:     1. Iatrogenic hypothyroidism - post ablation for Graves' disease in 1997    Plan:     Pt with longstanding, postablative hypothyroidism, with previous variable control due to noncompliance with levothyroxine  but with improved hypothyroidism control after she started to take the levothyroxine  correctly.  - latest thyroid  labs reviewed with pt. >> normal: Lab Results  Component Value Date   TSH 1.88 01/04/2023  - she continues on Synthroid  d.a.w. 150 mcg daily - pt feels good on this dose, but does complain of weight gain.  She is not exercising.  Also, occasionally skipping meals.  She gained 8 pounds since last visit.  We discussed that sometimes weight gain would prompt a need to increase the Synthroid  dose but usually with larger weight fluctuations. - she has no active signs of Graves' ophthalmopathy: No blurry vision, double vision, eye pain, chemosis - we discussed about taking the thyroid  hormone every day, with water, >30 minutes before breakfast, separated by >4 hours from acid reflux medications, calcium , iron, multivitamins. Pt. is taking it correctly. - will check thyroid  tests today: TSH  and fT4 - If labs are abnormal, she will need to return for repeat TFTs in 1.5 months - I will see her back in a year  Needs refills.  Component     Latest Ref Rng 04/06/2024  T4,Free(Direct)     0.8 - 1.8 ng/dL 1.3   TSH     mIU/L 9.10   Normal.  Lela Fendt, MD PhD [facility]

## 2024-04-06 NOTE — Patient Instructions (Signed)
Please stop at the lab.  Please restart Synthroid 150 mcg daily.  Take the thyroid hormone every day, with water, at least 30 minutes before breakfast, separated by at least 4 hours from: - acid reflux medications - calcium - iron - multivitamins  Please return in 1 year.

## 2024-04-07 ENCOUNTER — Ambulatory Visit: Payer: Self-pay | Admitting: Internal Medicine

## 2024-04-07 MED ORDER — SYNTHROID 150 MCG PO TABS: 150.0000 ug | ORAL_TABLET | Freq: Every day | ORAL | 2 refills | Status: DC

## 2024-04-07 NOTE — Addendum Note (Signed)
 Addended by: TRIXIE FILE on: 04/07/2024 08:25 AM   Modules accepted: Orders

## 2024-06-07 ENCOUNTER — Other Ambulatory Visit: Payer: Self-pay | Admitting: Obstetrics and Gynecology

## 2024-06-07 DIAGNOSIS — N644 Mastodynia: Secondary | ICD-10-CM

## 2024-06-23 ENCOUNTER — Other Ambulatory Visit: Payer: Self-pay | Admitting: Obstetrics and Gynecology

## 2024-06-23 ENCOUNTER — Ambulatory Visit
Admission: RE | Admit: 2024-06-23 | Discharge: 2024-06-23 | Disposition: A | Discharge status: Home / Self Care | Source: Ambulatory Visit | Attending: Obstetrics and Gynecology | Admitting: Obstetrics and Gynecology

## 2024-06-23 DIAGNOSIS — N644 Mastodynia: Secondary | ICD-10-CM

## 2024-06-23 DIAGNOSIS — N6311 Unspecified lump in the right breast, upper outer quadrant: Secondary | ICD-10-CM

## 2024-06-27 ENCOUNTER — Ambulatory Visit
Admission: RE | Admit: 2024-06-27 | Discharge: 2024-06-27 | Disposition: A | Source: Ambulatory Visit | Attending: Obstetrics and Gynecology | Admitting: Obstetrics and Gynecology

## 2024-06-27 ENCOUNTER — Inpatient Hospital Stay
Admission: RE | Admit: 2024-06-27 | Discharge: 2024-06-27 | Attending: Obstetrics and Gynecology | Admitting: Obstetrics and Gynecology

## 2024-06-27 DIAGNOSIS — N644 Mastodynia: Secondary | ICD-10-CM

## 2024-06-27 DIAGNOSIS — N6311 Unspecified lump in the right breast, upper outer quadrant: Secondary | ICD-10-CM

## 2024-06-27 HISTORY — PX: BREAST BIOPSY: SHX20

## 2024-06-28 LAB — SURGICAL PATHOLOGY

## 2024-09-15 ENCOUNTER — Encounter: Admitting: Nurse Practitioner

## 2025-04-06 ENCOUNTER — Ambulatory Visit: Admitting: Internal Medicine
